# Patient Record
Sex: Female | Born: 1945 | Race: Black or African American | Hispanic: No | Marital: Married | Smoking: Former smoker
Health system: Southern US, Community
[De-identification: ages and names within clinical notes are randomized; demographics above are authoritative.]

## PROBLEM LIST (undated history)

## (undated) DIAGNOSIS — R918 Other nonspecific abnormal finding of lung field: Secondary | ICD-10-CM

## (undated) DIAGNOSIS — C801 Malignant (primary) neoplasm, unspecified: Secondary | ICD-10-CM

## (undated) DIAGNOSIS — E876 Hypokalemia: Secondary | ICD-10-CM

## (undated) DIAGNOSIS — N390 Urinary tract infection, site not specified: Secondary | ICD-10-CM

## (undated) HISTORY — PX: CHOLECYSTECTOMY: SHX55

---

## 1998-08-23 ENCOUNTER — Emergency Department (HOSPITAL_COMMUNITY): Admission: EM | Admit: 1998-08-23 | Discharge: 1998-08-23 | Payer: Self-pay | Admitting: Emergency Medicine

## 2000-10-30 ENCOUNTER — Emergency Department (HOSPITAL_COMMUNITY): Admission: EM | Admit: 2000-10-30 | Discharge: 2000-10-30 | Payer: Self-pay | Admitting: Emergency Medicine

## 2000-10-30 ENCOUNTER — Encounter: Payer: Self-pay | Admitting: Emergency Medicine

## 2003-03-20 ENCOUNTER — Emergency Department (HOSPITAL_COMMUNITY): Admission: EM | Admit: 2003-03-20 | Discharge: 2003-03-20 | Payer: Self-pay | Admitting: Emergency Medicine

## 2003-03-27 ENCOUNTER — Encounter: Admission: RE | Admit: 2003-03-27 | Discharge: 2003-03-27 | Payer: Self-pay | Admitting: Internal Medicine

## 2009-10-25 ENCOUNTER — Emergency Department (HOSPITAL_COMMUNITY): Admission: EM | Admit: 2009-10-25 | Discharge: 2009-10-25 | Payer: Self-pay | Admitting: Emergency Medicine

## 2016-11-25 DIAGNOSIS — E876 Hypokalemia: Secondary | ICD-10-CM

## 2016-11-25 DIAGNOSIS — N390 Urinary tract infection, site not specified: Secondary | ICD-10-CM

## 2016-11-25 DIAGNOSIS — R918 Other nonspecific abnormal finding of lung field: Secondary | ICD-10-CM

## 2016-11-25 HISTORY — DX: Other nonspecific abnormal finding of lung field: R91.8

## 2016-11-25 HISTORY — DX: Urinary tract infection, site not specified: N39.0

## 2016-11-25 HISTORY — DX: Hypokalemia: E87.6

## 2016-12-10 ENCOUNTER — Inpatient Hospital Stay (HOSPITAL_COMMUNITY)
Admission: EM | Admit: 2016-12-10 | Discharge: 2016-12-19 | DRG: 166 | Disposition: A | Discharge status: Skilled Nursing Facility | Payer: Medicare Other | Attending: Family Medicine | Admitting: Family Medicine

## 2016-12-10 ENCOUNTER — Other Ambulatory Visit: Payer: Self-pay

## 2016-12-10 ENCOUNTER — Encounter (HOSPITAL_COMMUNITY): Payer: Self-pay

## 2016-12-10 ENCOUNTER — Emergency Department (HOSPITAL_COMMUNITY): Payer: Medicare Other

## 2016-12-10 DIAGNOSIS — R0600 Dyspnea, unspecified: Secondary | ICD-10-CM

## 2016-12-10 DIAGNOSIS — D6481 Anemia due to antineoplastic chemotherapy: Secondary | ICD-10-CM | POA: Diagnosis not present

## 2016-12-10 DIAGNOSIS — A419 Sepsis, unspecified organism: Secondary | ICD-10-CM | POA: Diagnosis present

## 2016-12-10 DIAGNOSIS — J90 Pleural effusion, not elsewhere classified: Secondary | ICD-10-CM

## 2016-12-10 DIAGNOSIS — R059 Cough, unspecified: Secondary | ICD-10-CM | POA: Diagnosis present

## 2016-12-10 DIAGNOSIS — J9600 Acute respiratory failure, unspecified whether with hypoxia or hypercapnia: Secondary | ICD-10-CM | POA: Diagnosis present

## 2016-12-10 DIAGNOSIS — R05 Cough: Secondary | ICD-10-CM | POA: Diagnosis present

## 2016-12-10 DIAGNOSIS — E876 Hypokalemia: Secondary | ICD-10-CM | POA: Diagnosis present

## 2016-12-10 DIAGNOSIS — N390 Urinary tract infection, site not specified: Secondary | ICD-10-CM | POA: Diagnosis present

## 2016-12-10 DIAGNOSIS — I871 Compression of vein: Secondary | ICD-10-CM | POA: Diagnosis present

## 2016-12-10 DIAGNOSIS — C3400 Malignant neoplasm of unspecified main bronchus: Principal | ICD-10-CM | POA: Diagnosis present

## 2016-12-10 DIAGNOSIS — C3490 Malignant neoplasm of unspecified part of unspecified bronchus or lung: Secondary | ICD-10-CM

## 2016-12-10 DIAGNOSIS — Z9889 Other specified postprocedural states: Secondary | ICD-10-CM

## 2016-12-10 DIAGNOSIS — J189 Pneumonia, unspecified organism: Secondary | ICD-10-CM | POA: Diagnosis present

## 2016-12-10 DIAGNOSIS — J9811 Atelectasis: Secondary | ICD-10-CM | POA: Diagnosis present

## 2016-12-10 DIAGNOSIS — R918 Other nonspecific abnormal finding of lung field: Secondary | ICD-10-CM | POA: Diagnosis present

## 2016-12-10 DIAGNOSIS — B9689 Other specified bacterial agents as the cause of diseases classified elsewhere: Secondary | ICD-10-CM | POA: Diagnosis present

## 2016-12-10 DIAGNOSIS — T451X5A Adverse effect of antineoplastic and immunosuppressive drugs, initial encounter: Secondary | ICD-10-CM | POA: Diagnosis not present

## 2016-12-10 DIAGNOSIS — Z87891 Personal history of nicotine dependence: Secondary | ICD-10-CM

## 2016-12-10 DIAGNOSIS — R7881 Bacteremia: Secondary | ICD-10-CM

## 2016-12-10 HISTORY — DX: Other nonspecific abnormal finding of lung field: R91.8

## 2016-12-10 HISTORY — DX: Urinary tract infection, site not specified: N39.0

## 2016-12-10 HISTORY — DX: Hypokalemia: E87.6

## 2016-12-10 LAB — URINALYSIS, ROUTINE W REFLEX MICROSCOPIC
Glucose, UA: NEGATIVE mg/dL
Hgb urine dipstick: NEGATIVE
KETONES UR: 20 mg/dL — AB
Nitrite: NEGATIVE
PROTEIN: 100 mg/dL — AB
Specific Gravity, Urine: 1.034 — ABNORMAL HIGH (ref 1.005–1.030)
pH: 5 (ref 5.0–8.0)

## 2016-12-10 LAB — COMPREHENSIVE METABOLIC PANEL
ALT: 14 U/L (ref 14–54)
AST: 37 U/L (ref 15–41)
Albumin: 3.3 g/dL — ABNORMAL LOW (ref 3.5–5.0)
Alkaline Phosphatase: 66 U/L (ref 38–126)
Anion gap: 17 — ABNORMAL HIGH (ref 5–15)
BUN: 10 mg/dL (ref 6–20)
CO2: 23 mmol/L (ref 22–32)
Calcium: 9.4 mg/dL (ref 8.9–10.3)
Chloride: 95 mmol/L — ABNORMAL LOW (ref 101–111)
Creatinine, Ser: 1.06 mg/dL — ABNORMAL HIGH (ref 0.44–1.00)
GFR calc Af Amer: >60 mL/min (ref >60)
GFR calc non Af Amer: 53 mL/min — ABNORMAL LOW (ref >60)
Glucose, Bld: 102 mg/dL — ABNORMAL HIGH (ref 65–99)
Potassium: 2.8 mmol/L — ABNORMAL LOW (ref 3.5–5.1)
Sodium: 135 mmol/L (ref 135–145)
Total Bilirubin: 1.8 mg/dL — ABNORMAL HIGH (ref 0.3–1.2)
Total Protein: 7.7 g/dL (ref 6.5–8.1)

## 2016-12-10 LAB — CBC WITH DIFFERENTIAL/PLATELET
Basophils Absolute: 0 10*3/uL (ref 0.0–0.1)
Basophils Relative: 0 %
EOS ABS: 0.1 10*3/uL (ref 0.0–0.7)
EOS PCT: 1 %
HCT: 41.3 % (ref 36.0–46.0)
HEMOGLOBIN: 13 g/dL (ref 12.0–15.0)
LYMPHS PCT: 9 %
Lymphs Abs: 1.1 10*3/uL (ref 0.7–4.0)
MCH: 26.7 pg (ref 26.0–34.0)
MCHC: 31.5 g/dL (ref 30.0–36.0)
MCV: 84.8 fL (ref 78.0–100.0)
MONOS PCT: 6 %
Monocytes Absolute: 0.7 10*3/uL (ref 0.1–1.0)
NEUTROS PCT: 84 %
Neutro Abs: 10.4 10*3/uL — ABNORMAL HIGH (ref 1.7–7.7)
Platelets: 451 10*3/uL — ABNORMAL HIGH (ref 150–400)
RBC: 4.87 MIL/uL (ref 3.87–5.11)
RDW: 15 % (ref 11.5–15.5)
WBC: 12.4 10*3/uL — AB (ref 4.0–10.5)

## 2016-12-10 LAB — BRAIN NATRIURETIC PEPTIDE: B Natriuretic Peptide: 41.4 pg/mL (ref 0.0–100.0)

## 2016-12-10 LAB — TROPONIN I

## 2016-12-10 LAB — I-STAT CG4 LACTIC ACID, ED: Lactic Acid, Venous: 3 mmol/L (ref 0.5–1.9)

## 2016-12-10 MED ORDER — POTASSIUM CHLORIDE 10 MEQ/100ML IV SOLN
10.0000 meq | INTRAVENOUS | Status: AC
  Administered 2016-12-11 (×3): 10 meq via INTRAVENOUS
  Filled 2016-12-10 (×3): qty 100

## 2016-12-10 NOTE — ED Notes (Signed)
Positive Lactic Acid called from lab.  Dr. Darl Householder made aware, will move to next available room.  Charge nurse made aware as well

## 2016-12-10 NOTE — ED Provider Notes (Signed)
[facility] DEPT Provider Note   CSN: [id number] Arrival date & time: 12/10/16  2113  By signing my name below, I, Marcello Moores, attest that this documentation has been prepared under the direction and in the presence of Ramina Hulet, Annie Main, MD. Electronically Signed: Marcello Moores, ED Scribe. 12/10/16. 11:26 PM.  History   Chief Complaint Chief Complaint  Patient presents with  . Cough  . Nasal Congestion   The history is provided by the patient. No language interpreter was used.     HPI Comments: Amanda Macias is a 71 y.o. female who presents to the Emergency Department complaining of moderate, gradually worsening non-productive cough, nasal congestion that began four months ago. Pt has associated fatigue, nausea, diarrhea, subjective fever that began four days ago. She reports that this is the first time she has been seen about her symptoms. The pt denies a PMHx of asthma, COPD, heart problems (including MI and CAD) and lung problems (including PE and DVT) vomiting, abdominal pain, chest pain and leg swelling. Marland Kitchen Her last BM was 4 days ago. Her SHx includes smoking where she quit 15 years ago and used to smoke 1 PPD. NKDA.  No past medical history on file.  There are no active problems to display for this patient.   No past surgical history on file.  OB History    No data available       Home Medications    Prior to Admission medications   Not on File    Family History No family history on file.  Social History Social History  Substance Use Topics  . Smoking status: Not on file  . Smokeless tobacco: Not on file  . Alcohol use Not on file     Allergies   Patient has no allergy information on record.   Review of Systems Review of Systems 10 Systems reviewed and are negative for acute change except as noted in the HPI.   Physical Exam Updated Vital Signs BP (!) 150/75 (BP Location: Right Arm)   Pulse (!) 117   Temp 99.3 F (37.4 C) (Oral)    Resp 16   SpO2 95%   Physical Exam  Constitutional: She is oriented to person, place, and time. She appears well-developed and well-nourished. No distress.  HENT:  Head: Normocephalic and atraumatic.  Mouth/Throat: Oropharynx is clear and moist. No oropharyngeal exudate.  Thrush on tongue Hoarse voice   Eyes: Conjunctivae and EOM are normal. Pupils are equal, round, and reactive to light.  Neck: Normal range of motion. Neck supple.  No meningismus.  Cardiovascular: Normal rate, regular rhythm, normal heart sounds and intact distal pulses.   No murmur heard. Pulmonary/Chest: Effort normal and breath sounds normal. No respiratory distress.  Decreased breath sounds on left  Abdominal: Soft. There is no tenderness. There is no rebound and no guarding.  Musculoskeletal: Normal range of motion. She exhibits no edema or tenderness.  Neurological: She is alert and oriented to person, place, and time. No cranial nerve deficit. She exhibits normal muscle tone. Coordination normal.   5/5 strength throughout. CN 2-12 intact.Equal grip strength.   Skin: Skin is warm.  Psychiatric: She has a normal mood and affect. Her behavior is normal.  Nursing note and vitals reviewed.    ED Treatments / Results   DIAGNOSTIC STUDIES: Oxygen Saturation is 95% on RA, low by my interpretation.   COORDINATION OF CARE: 11:04 PM-Discussed next steps with pt. Pt verbalized understanding and is agreeable with the plan.  Labs (all labs ordered are listed, but only abnormal results are displayed) Labs Reviewed  COMPREHENSIVE METABOLIC PANEL - Abnormal; Notable for the following:       Result Value   Potassium 2.8 (*)    Chloride 95 (*)    Glucose, Bld 102 (*)    Creatinine, Ser 1.06 (*)    Albumin 3.3 (*)    Total Bilirubin 1.8 (*)    GFR calc non Af Amer 53 (*)    Anion gap 17 (*)    All other components within normal limits  CBC WITH DIFFERENTIAL/PLATELET - Abnormal; Notable for the following:     WBC 12.4 (*)    Platelets 451 (*)    Neutro Abs 10.4 (*)    All other components within normal limits  URINALYSIS, ROUTINE W REFLEX MICROSCOPIC - Abnormal; Notable for the following:    Color, Urine AMBER (*)    APPearance CLOUDY (*)    Specific Gravity, Urine 1.034 (*)    Bilirubin Urine MODERATE (*)    Ketones, ur 20 (*)    Protein, ur 100 (*)    Leukocytes, UA SMALL (*)    Bacteria, UA MANY (*)    Squamous Epithelial / LPF 0-5 (*)    All other components within normal limits  MAGNESIUM - Abnormal; Notable for the following:    Magnesium 1.3 (*)    All other components within normal limits  PROTIME-INR - Abnormal; Notable for the following:    Prothrombin Time 16.6 (*)    All other components within normal limits  APTT - Abnormal; Notable for the following:    aPTT 42 (*)    All other components within normal limits  COMPREHENSIVE METABOLIC PANEL - Abnormal; Notable for the following:    Sodium 132 (*)    Potassium 2.8 (*)    Chloride 94 (*)    Glucose, Bld 308 (*)    Calcium 8.5 (*)    Albumin 2.8 (*)    ALT 12 (*)    Total Bilirubin 1.5 (*)    Anion gap 16 (*)    All other components within normal limits  CBC - Abnormal; Notable for the following:    WBC 10.6 (*)    All other components within normal limits  D-DIMER, QUANTITATIVE (NOT AT [facility]) - Abnormal; Notable for the following:    D-Dimer, Quant 8.71 (*)    All other components within normal limits  I-STAT CG4 LACTIC ACID, ED - Abnormal; Notable for the following:    Lactic Acid, Venous 3.00 (*)    All other components within normal limits  I-STAT CG4 LACTIC ACID, ED - Abnormal; Notable for the following:    Lactic Acid, Venous 2.38 (*)    All other components within normal limits  CULTURE, BLOOD (ROUTINE X 2)  CULTURE, BLOOD (ROUTINE X 2)  CULTURE, EXPECTORATED SPUTUM-ASSESSMENT  URINE CULTURE  GRAM STAIN  BRAIN NATRIURETIC PEPTIDE  TROPONIN I  LACTIC ACID, PLASMA  PROCALCITONIN  STREP PNEUMONIAE  URINARY ANTIGEN  HEPARIN LEVEL (UNFRACTIONATED)  HIV ANTIBODY (ROUTINE TESTING)  LEGIONELLA PNEUMOPHILA SEROGP 1 UR AG    EKG  EKG Interpretation None       Radiology Dg Chest 2 View  Result Date: 12/10/2016 CLINICAL DATA:  Cough and nasal congestion for 1 month. EXAM: CHEST  2 VIEW COMPARISON:  None. FINDINGS: There is complete opacification of the left hemithorax. No right-sided pneumothorax. The visualized right lung is clear. The cardiomediastinal silhouette is partially obscured by the  left-sided opacification but no abnormalities are seen. IMPRESSION: Complete opacification of the left hemithorax. A CT scan could further evaluate if clinically warranted. Electronically Signed   By: Dorise Bullion III M.D   On: 12/10/2016 22:18    Procedures Procedures (including critical care time)  Medications Ordered in ED Medications - No data to display   Initial Impression / Assessment and Plan / ED Course  I have reviewed the triage vital signs and the nursing notes.  Pertinent labs & imaging results that were available during my care of the patient were reviewed by me and considered in my medical decision making (see chart for details).     Patient presents with 4 month history of hoarseness, cough and nasal congestion. She has decreased breath sounds on the left with opacity of L hemithorax on Xray.  No distress. She has mild tachypnea but saturating well on nasal cannula. Labs show leukocytosis with elevated lactate. Treated for sepsis for possible pneumonia. Cultures obtained and IV antibiotics given. IV fluids given.  CT scan shows large lung mass with pleural effusion. Cannot rule out small pulmonary emboli. Treat with IV heparin.  Results discussed with patient. Plan admission as she has no PCP.  D/w Dr. Blaine Hamper,  Final Clinical Impressions(sd/ ED Diagnoses   Final diagnoses:  Mass of left lung  Pleural effusion    New Prescriptions New Prescriptions   No  medications on file   I personally performed the services described in this documentation, which was scribed in my presence. The recorded information has been reviewed and is accurate.    Ezequiel Essex, MD 12/11/16 [phone]

## 2016-12-10 NOTE — ED Triage Notes (Signed)
Per EMS: Pt complaining of cough and nasal congestion x 1 month. Pt states coughing up yellow/white sputum. Pt denies any chest pain or SOB.

## 2016-12-11 ENCOUNTER — Encounter (HOSPITAL_COMMUNITY): Payer: Self-pay | Admitting: Internal Medicine

## 2016-12-11 ENCOUNTER — Emergency Department (HOSPITAL_COMMUNITY): Payer: Medicare Other

## 2016-12-11 DIAGNOSIS — E876 Hypokalemia: Secondary | ICD-10-CM | POA: Diagnosis present

## 2016-12-11 DIAGNOSIS — R059 Cough, unspecified: Secondary | ICD-10-CM | POA: Diagnosis present

## 2016-12-11 DIAGNOSIS — R918 Other nonspecific abnormal finding of lung field: Secondary | ICD-10-CM | POA: Diagnosis not present

## 2016-12-11 DIAGNOSIS — R7881 Bacteremia: Secondary | ICD-10-CM | POA: Diagnosis not present

## 2016-12-11 DIAGNOSIS — Z87891 Personal history of nicotine dependence: Secondary | ICD-10-CM | POA: Diagnosis not present

## 2016-12-11 DIAGNOSIS — J9600 Acute respiratory failure, unspecified whether with hypoxia or hypercapnia: Secondary | ICD-10-CM | POA: Diagnosis present

## 2016-12-11 DIAGNOSIS — N3000 Acute cystitis without hematuria: Secondary | ICD-10-CM | POA: Diagnosis not present

## 2016-12-11 DIAGNOSIS — J189 Pneumonia, unspecified organism: Secondary | ICD-10-CM | POA: Diagnosis present

## 2016-12-11 DIAGNOSIS — B960 Mycoplasma pneumoniae [M. pneumoniae] as the cause of diseases classified elsewhere: Secondary | ICD-10-CM | POA: Diagnosis not present

## 2016-12-11 DIAGNOSIS — M7989 Other specified soft tissue disorders: Secondary | ICD-10-CM | POA: Diagnosis not present

## 2016-12-11 DIAGNOSIS — C3490 Malignant neoplasm of unspecified part of unspecified bronchus or lung: Secondary | ICD-10-CM | POA: Diagnosis not present

## 2016-12-11 DIAGNOSIS — A4151 Sepsis due to Escherichia coli [E. coli]: Secondary | ICD-10-CM | POA: Diagnosis not present

## 2016-12-11 DIAGNOSIS — C3400 Malignant neoplasm of unspecified main bronchus: Secondary | ICD-10-CM | POA: Diagnosis present

## 2016-12-11 DIAGNOSIS — J9811 Atelectasis: Secondary | ICD-10-CM | POA: Diagnosis present

## 2016-12-11 DIAGNOSIS — N390 Urinary tract infection, site not specified: Secondary | ICD-10-CM | POA: Diagnosis not present

## 2016-12-11 DIAGNOSIS — A419 Sepsis, unspecified organism: Secondary | ICD-10-CM | POA: Diagnosis not present

## 2016-12-11 DIAGNOSIS — R05 Cough: Secondary | ICD-10-CM | POA: Diagnosis not present

## 2016-12-11 DIAGNOSIS — D6481 Anemia due to antineoplastic chemotherapy: Secondary | ICD-10-CM | POA: Diagnosis not present

## 2016-12-11 DIAGNOSIS — J90 Pleural effusion, not elsewhere classified: Secondary | ICD-10-CM | POA: Diagnosis present

## 2016-12-11 DIAGNOSIS — B9689 Other specified bacterial agents as the cause of diseases classified elsewhere: Secondary | ICD-10-CM | POA: Diagnosis not present

## 2016-12-11 DIAGNOSIS — Z5111 Encounter for antineoplastic chemotherapy: Secondary | ICD-10-CM | POA: Diagnosis not present

## 2016-12-11 DIAGNOSIS — J918 Pleural effusion in other conditions classified elsewhere: Secondary | ICD-10-CM | POA: Diagnosis not present

## 2016-12-11 DIAGNOSIS — R0600 Dyspnea, unspecified: Secondary | ICD-10-CM | POA: Diagnosis present

## 2016-12-11 DIAGNOSIS — B9620 Unspecified Escherichia coli [E. coli] as the cause of diseases classified elsewhere: Secondary | ICD-10-CM | POA: Diagnosis not present

## 2016-12-11 DIAGNOSIS — I871 Compression of vein: Secondary | ICD-10-CM | POA: Diagnosis present

## 2016-12-11 DIAGNOSIS — T451X5A Adverse effect of antineoplastic and immunosuppressive drugs, initial encounter: Secondary | ICD-10-CM | POA: Diagnosis not present

## 2016-12-11 DIAGNOSIS — R110 Nausea: Secondary | ICD-10-CM | POA: Diagnosis not present

## 2016-12-11 LAB — BLOOD CULTURE ID PANEL (REFLEXED)
Acinetobacter baumannii: NOT DETECTED
Acinetobacter baumannii: NOT DETECTED
CANDIDA KRUSEI: NOT DETECTED
CANDIDA PARAPSILOSIS: NOT DETECTED
CANDIDA TROPICALIS: NOT DETECTED
CARBAPENEM RESISTANCE: NOT DETECTED
Candida albicans: NOT DETECTED
Candida albicans: NOT DETECTED
Candida glabrata: NOT DETECTED
Candida glabrata: NOT DETECTED
Candida krusei: NOT DETECTED
Candida parapsilosis: NOT DETECTED
Candida tropicalis: NOT DETECTED
ENTEROBACTERIACEAE SPECIES: DETECTED — AB
Enterobacter cloacae complex: DETECTED — AB
Enterobacter cloacae complex: NOT DETECTED
Enterobacteriaceae species: NOT DETECTED
Enterococcus species: NOT DETECTED
Enterococcus species: NOT DETECTED
Escherichia coli: NOT DETECTED
Escherichia coli: NOT DETECTED
HAEMOPHILUS INFLUENZAE: NOT DETECTED
Haemophilus influenzae: NOT DETECTED
KLEBSIELLA PNEUMONIAE: NOT DETECTED
Klebsiella oxytoca: NOT DETECTED
Klebsiella oxytoca: NOT DETECTED
Klebsiella pneumoniae: NOT DETECTED
Listeria monocytogenes: NOT DETECTED
Listeria monocytogenes: NOT DETECTED
Neisseria meningitidis: NOT DETECTED
Neisseria meningitidis: NOT DETECTED
Proteus species: NOT DETECTED
Proteus species: NOT DETECTED
Pseudomonas aeruginosa: NOT DETECTED
Pseudomonas aeruginosa: NOT DETECTED
SERRATIA MARCESCENS: NOT DETECTED
STAPHYLOCOCCUS AUREUS BCID: NOT DETECTED
STAPHYLOCOCCUS SPECIES: NOT DETECTED
STREPTOCOCCUS SPECIES: NOT DETECTED
Serratia marcescens: NOT DETECTED
Staphylococcus aureus (BCID): NOT DETECTED
Staphylococcus species: NOT DETECTED
Streptococcus agalactiae: NOT DETECTED
Streptococcus agalactiae: NOT DETECTED
Streptococcus pneumoniae: NOT DETECTED
Streptococcus pneumoniae: NOT DETECTED
Streptococcus pyogenes: NOT DETECTED
Streptococcus pyogenes: NOT DETECTED
Streptococcus species: NOT DETECTED

## 2016-12-11 LAB — HEPARIN LEVEL (UNFRACTIONATED)
HEPARIN UNFRACTIONATED: 0.49 [IU]/mL (ref 0.30–0.70)
Heparin Unfractionated: 0.48 IU/mL (ref 0.30–0.70)

## 2016-12-11 LAB — CBC
HEMATOCRIT: 37.6 % (ref 36.0–46.0)
Hemoglobin: 12 g/dL (ref 12.0–15.0)
MCH: 26.5 pg (ref 26.0–34.0)
MCHC: 31.9 g/dL (ref 30.0–36.0)
MCV: 83.2 fL (ref 78.0–100.0)
PLATELETS: 385 10*3/uL (ref 150–400)
RBC: 4.52 MIL/uL (ref 3.87–5.11)
RDW: 14.9 % (ref 11.5–15.5)
WBC: 10.6 10*3/uL — AB (ref 4.0–10.5)

## 2016-12-11 LAB — COMPREHENSIVE METABOLIC PANEL
ALT: 12 U/L — AB (ref 14–54)
AST: 29 U/L (ref 15–41)
Albumin: 2.8 g/dL — ABNORMAL LOW (ref 3.5–5.0)
Alkaline Phosphatase: 54 U/L (ref 38–126)
Anion gap: 16 — ABNORMAL HIGH (ref 5–15)
BUN: 8 mg/dL (ref 6–20)
CHLORIDE: 94 mmol/L — AB (ref 101–111)
CO2: 22 mmol/L (ref 22–32)
CREATININE: 0.88 mg/dL (ref 0.44–1.00)
Calcium: 8.5 mg/dL — ABNORMAL LOW (ref 8.9–10.3)
Glucose, Bld: 308 mg/dL — ABNORMAL HIGH (ref 65–99)
POTASSIUM: 2.8 mmol/L — AB (ref 3.5–5.1)
SODIUM: 132 mmol/L — AB (ref 135–145)
Total Bilirubin: 1.5 mg/dL — ABNORMAL HIGH (ref 0.3–1.2)
Total Protein: 6.6 g/dL (ref 6.5–8.1)

## 2016-12-11 LAB — APTT: aPTT: 42 seconds — ABNORMAL HIGH (ref 24–36)

## 2016-12-11 LAB — I-STAT CG4 LACTIC ACID, ED: Lactic Acid, Venous: 2.38 mmol/L (ref 0.5–1.9)

## 2016-12-11 LAB — LACTIC ACID, PLASMA: LACTIC ACID, VENOUS: 1.7 mmol/L (ref 0.5–1.9)

## 2016-12-11 LAB — PROTIME-INR
INR: 1.33
Prothrombin Time: 16.6 seconds — ABNORMAL HIGH (ref 11.4–15.2)

## 2016-12-11 LAB — STREP PNEUMONIAE URINARY ANTIGEN: Strep Pneumo Urinary Antigen: NEGATIVE

## 2016-12-11 LAB — D-DIMER, QUANTITATIVE: D-Dimer, Quant: 8.71 ug/mL-FEU — ABNORMAL HIGH (ref 0.00–0.50)

## 2016-12-11 LAB — PROCALCITONIN

## 2016-12-11 LAB — GLUCOSE, CAPILLARY: Glucose-Capillary: 113 mg/dL — ABNORMAL HIGH (ref 65–99)

## 2016-12-11 LAB — MAGNESIUM: MAGNESIUM: 1.3 mg/dL — AB (ref 1.7–2.4)

## 2016-12-11 LAB — HIV ANTIBODY (ROUTINE TESTING W REFLEX): HIV Screen 4th Generation wRfx: NONREACTIVE

## 2016-12-11 MED ORDER — ALBUTEROL SULFATE (2.5 MG/3ML) 0.083% IN NEBU: 2.5000 mg | INHALATION_SOLUTION | RESPIRATORY_TRACT | Status: DC | PRN

## 2016-12-11 MED ORDER — HEPARIN BOLUS VIA INFUSION
5000.0000 [IU] | Freq: Once | INTRAVENOUS | Status: AC
  Administered 2016-12-11: 5000 [IU] via INTRAVENOUS
  Filled 2016-12-11: qty 5000

## 2016-12-11 MED ORDER — ZOLPIDEM TARTRATE 5 MG PO TABS: 5.0000 mg | ORAL_TABLET | Freq: Every evening | ORAL | Status: DC | PRN

## 2016-12-11 MED ORDER — DEXTROSE 5 % IV SOLN
1.0000 g | Freq: Once | INTRAVENOUS | Status: AC
  Administered 2016-12-11: 1 g via INTRAVENOUS
  Filled 2016-12-11: qty 10

## 2016-12-11 MED ORDER — DM-GUAIFENESIN ER 30-600 MG PO TB12
1.0000 | ORAL_TABLET | Freq: Two times a day (BID) | ORAL | Status: DC
  Administered 2016-12-11 – 2016-12-19 (×15): 1 via ORAL
  Filled 2016-12-11 (×17): qty 1

## 2016-12-11 MED ORDER — SODIUM CHLORIDE 0.9% FLUSH
3.0000 mL | Freq: Two times a day (BID) | INTRAVENOUS | Status: DC
  Administered 2016-12-11 – 2016-12-18 (×8): 3 mL via INTRAVENOUS

## 2016-12-11 MED ORDER — ACETAMINOPHEN 650 MG RE SUPP: 650.0000 mg | Freq: Four times a day (QID) | RECTAL | Status: DC | PRN

## 2016-12-11 MED ORDER — SODIUM CHLORIDE 0.9 % IV BOLUS (SEPSIS)
2000.0000 mL | Freq: Once | INTRAVENOUS | Status: AC
  Administered 2016-12-11: 1000 mL via INTRAVENOUS

## 2016-12-11 MED ORDER — HEPARIN (PORCINE) IN NACL 100-0.45 UNIT/ML-% IJ SOLN
1400.0000 [IU]/h | INTRAMUSCULAR | Status: DC
  Administered 2016-12-11 (×2): 1400 [IU]/h via INTRAVENOUS
  Filled 2016-12-11 (×2): qty 250

## 2016-12-11 MED ORDER — ACETAMINOPHEN 325 MG PO TABS: 650.0000 mg | ORAL_TABLET | Freq: Four times a day (QID) | ORAL | Status: DC | PRN

## 2016-12-11 MED ORDER — ONDANSETRON HCL 4 MG PO TABS: 4.0000 mg | ORAL_TABLET | Freq: Four times a day (QID) | ORAL | Status: DC | PRN

## 2016-12-11 MED ORDER — MAGNESIUM SULFATE IN D5W 1-5 GM/100ML-% IV SOLN
1.0000 g | Freq: Once | INTRAVENOUS | Status: AC
  Administered 2016-12-11: 1 g via INTRAVENOUS
  Filled 2016-12-11: qty 100

## 2016-12-11 MED ORDER — DEXTROSE 5 % IV SOLN
500.0000 mg | Freq: Once | INTRAVENOUS | Status: AC
  Administered 2016-12-11: 500 mg via INTRAVENOUS
  Filled 2016-12-11: qty 500

## 2016-12-11 MED ORDER — DEXTROSE 5 % IV SOLN
1.0000 g | Freq: Every day | INTRAVENOUS | Status: DC
  Filled 2016-12-11: qty 10

## 2016-12-11 MED ORDER — CEFEPIME HCL 1 G IJ SOLR
1.0000 g | Freq: Three times a day (TID) | INTRAMUSCULAR | Status: DC
  Administered 2016-12-11 – 2016-12-15 (×12): 1 g via INTRAVENOUS
  Filled 2016-12-11 (×13): qty 1

## 2016-12-11 MED ORDER — SODIUM CHLORIDE 0.9 % IV SOLN
INTRAVENOUS | Status: DC
  Administered 2016-12-11 (×2): via INTRAVENOUS
  Administered 2016-12-12 (×2): 1000 mL via INTRAVENOUS
  Administered 2016-12-13 – 2016-12-18 (×4): via INTRAVENOUS

## 2016-12-11 MED ORDER — POTASSIUM CHLORIDE 20 MEQ/15ML (10%) PO SOLN
40.0000 meq | Freq: Once | ORAL | Status: AC
  Administered 2016-12-11: 40 meq via ORAL
  Filled 2016-12-11: qty 30

## 2016-12-11 MED ORDER — ONDANSETRON HCL 4 MG/2ML IJ SOLN: 4.0000 mg | Freq: Four times a day (QID) | INTRAMUSCULAR | Status: DC | PRN

## 2016-12-11 MED ORDER — DEXTROSE 5 % IV SOLN
500.0000 mg | INTRAVENOUS | Status: DC
  Administered 2016-12-12 – 2016-12-13 (×2): 500 mg via INTRAVENOUS
  Filled 2016-12-11 (×2): qty 500

## 2016-12-11 MED ORDER — IOPAMIDOL (ISOVUE-370) INJECTION 76%
INTRAVENOUS | Status: AC
  Administered 2016-12-11: 100 mL
  Filled 2016-12-11: qty 100

## 2016-12-11 MED ORDER — SODIUM CHLORIDE 0.9 % IV BOLUS (SEPSIS)
1000.0000 mL | Freq: Once | INTRAVENOUS | Status: AC
  Administered 2016-12-11: 1000 mL via INTRAVENOUS

## 2016-12-11 NOTE — H&P (Signed)
History and Physical    OUITA NISH EUM:[id number] DOB: [birth year] DOA: 12/10/2016  Referring MD/NP/PA:   PCP: System, Pcp Not In   Patient coming from:  The patient is coming from home.  At baseline, pt is independent for most of ADL.   Chief Complaint: Cough, shortness breath, generalized weakness, weight loss  HPI: Amanda Macias is a 71 y.o. female without significant past medical history except for former smoking, who presents with cough, shortness breath, generalized weakness and weight loss.  Patient states that she has been coughing for about 1 month. She coughs up yellow colored sputum. She also has SOB, but no chest pain, fever or chills. She has hoarseness. She has generalized weakness, but no unilateral weakness or numbness in extremities. She states that she has weight loss recently, but does not know exactly how much weight she lost. Denies nausea, vomiting, diarrhea, abdominal pai.  Urinary right n. No symptoms of UTI. Denies family history of cancer.  ED Course: pt was found to have WBC 12.4, lactate 3.00, 2.38, potassium 2.8, creatinine 1.06, negative troponin, urinalysis with small amount of leukocytes and many bacteria, temperature 99.3, tachycardia, tachypnea, oxygen sat 92% on room air, chest x-ray showed complete left opacity. Pt is admitted to tele bed as inpt.  CTA of chest showed 1. Excessive respiratory motion artifact limits the study. No central embolus is visualized. Under filling of subsegmental right lower lobe pulmonary artery branch vessels could be secondary to artifact although small emboli are difficult to exclude. 2. Large left pleural effusion. Large left upper lobe infiltrative lung mass extending into the left apex. There is mediastinal and hilar invasion by the mass. Vascular encasement of the left pulmonary artery and upper lobe arterial branches by tumor. Tumor encasement of the great vessels of the aortic arch, primarily the left common carotid and  subclavian vessels. 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 3. Soft tissue density within the left hilus and subcarinal region could relate to additional tumor or matted adenopathy.  Review of Systems:   General: No fevers, chills, has weight loss, has poor appetite, has fatigue HEENT: no blurry vision, hearing changes or sore throat Respiratory: has dyspnea, coughing, no wheezing CV: no chest pain, no palpitations GI: no nausea, vomiting, abdominal pain, diarrhea, constipation GU: no dysuria, burning on urination, increased urinary frequency, hematuria  Ext: no leg edema Neuro: no unilateral weakness, numbness, or tingling, no vision change or hearing loss Skin: no rash, no skin tear. MSK: No muscle spasm, no deformity, no limitation of range of movement in spin Heme: No easy bruising.  Travel history: No recent long distant travel.  Allergy: No Known Allergies  No past medical history on file.  Past Surgical History:  Procedure Laterality Date  . CHOLECYSTECTOMY      Social History:  reports that she has quit smoking. She does not have any smokeless tobacco history on file. She reports that she does not drink alcohol or use drugs.  Family History:  Family History  Problem Relation Age of Onset  . Stroke Father      Prior to Admission medications   Not on File    Physical Exam: Vitals:   12/11/16 0000 12/11/16 0200 12/11/16 0224 12/11/16 0230  BP: 118/74 118/70  99/78  Pulse: 93 97  90  Resp: (!) 22 (!) 24  (!) 23  Temp:      TempSrc:  SpO2: 98% 97%  100%  Weight:   112.5 kg (248 lb 2 oz)   Height:   5\' 5"  (1.651 m)    General: Not in acute distress HEENT:       Eyes: PERRL, EOMI, no scleral icterus.       ENT: No discharge from the ears and nose, no pharynx injection, no tonsillar enlargement.        Neck: No JVD, no bruit, no mass felt. Heme: No neck  lymph node enlargement. Cardiac: S1/S2, RRR, No murmurs, No gallops or rubs. Respiratory: decreased air movement on the left side. No rales, wheezing, rhonchi or rubs. GI: Soft, nondistended, nontender, no rebound pain, no organomegaly, BS present. GU: No hematuria Ext: No pitting leg edema bilaterally. 2+DP/PT pulse bilaterally. Musculoskeletal: No joint deformities, No joint redness or warmth, no limitation of ROM in spin. Skin: No rashes.  Neuro: Alert, oriented X3, cranial nerves II-XII grossly intact, moves all extremities normally.  Psych: Patient is not psychotic, no suicidal or hemocidal ideation.  Labs on Admission: I have personally reviewed following labs and imaging studies  CBC:  Recent Labs Lab 12/10/16 2214  WBC 12.4*  NEUTROABS 10.4*  HGB 13.0  HCT 41.3  MCV 84.8  PLT 846*   Basic Metabolic Panel:  Recent Labs Lab 12/10/16 2214  NA 135  K 2.8*  CL 95*  CO2 23  GLUCOSE 102*  BUN 10  CREATININE 1.06*  CALCIUM 9.4   GFR: Estimated Creatinine Clearance: 63.5 mL/min (A) (by C-G formula based on SCr of 1.06 mg/dL (H)). Liver Function Tests:  Recent Labs Lab 12/10/16 2214  AST 37  ALT 14  ALKPHOS 66  BILITOT 1.8*  PROT 7.7  ALBUMIN 3.3*   No results for input(s): LIPASE, AMYLASE in the last 168 hours. No results for input(s): AMMONIA in the last 168 hours. Coagulation Profile: No results for input(s): INR, PROTIME in the last 168 hours. Cardiac Enzymes:  Recent Labs Lab 12/10/16 2214  TROPONINI <0.03   BNP (last 3 results) No results for input(s): PROBNP in the last 8760 hours. HbA1C: No results for input(s): HGBA1C in the last 72 hours. CBG: No results for input(s): GLUCAP in the last 168 hours. Lipid Profile: No results for input(s): CHOL, HDL, LDLCALC, TRIG, CHOLHDL, LDLDIRECT in the last 72 hours. Thyroid Function Tests: No results for input(s): TSH, T4TOTAL, FREET4, T3FREE, THYROIDAB in the last 72 hours. Anemia Panel: No  results for input(s): VITAMINB12, FOLATE, FERRITIN, TIBC, IRON, RETICCTPCT in the last 72 hours. Urine analysis:    Component Value Date/Time   COLORURINE AMBER (A) 12/10/2016 2208   APPEARANCEUR CLOUDY (A) 12/10/2016 2208   LABSPEC 1.034 (H) 12/10/2016 2208   PHURINE 5.0 12/10/2016 2208   GLUCOSEU NEGATIVE 12/10/2016 2208   HGBUR NEGATIVE 12/10/2016 2208   BILIRUBINUR MODERATE (A) 12/10/2016 2208   KETONESUR 20 (A) 12/10/2016 2208   PROTEINUR 100 (A) 12/10/2016 2208   NITRITE NEGATIVE 12/10/2016 2208   LEUKOCYTESUR SMALL (A) 12/10/2016 2208   Sepsis Labs: @LABRCNTIP (procalcitonin:4,lacticidven:4) )No results found for this or any previous visit (from the past 240 hour(s)).   Radiological Exams on Admission: Dg Chest 2 View  Result Date: 12/10/2016 CLINICAL DATA:  Cough and nasal congestion for 1 month. EXAM: CHEST  2 VIEW COMPARISON:  None. FINDINGS: There is complete opacification of the left hemithorax. No right-sided pneumothorax. The visualized right lung is clear. The cardiomediastinal silhouette is partially obscured by the left-sided opacification but no abnormalities are seen. IMPRESSION: Complete  opacification of the left hemithorax. A CT scan could further evaluate if clinically warranted. Electronically Signed   By: Dorise Bullion III M.D   On: 12/10/2016 22:18   Ct Angio Chest Pe W Or Wo Contrast  Result Date: 12/11/2016 CLINICAL DATA:  Follow-up abnormal chest x-ray EXAM: CT ANGIOGRAPHY CHEST WITH CONTRAST TECHNIQUE: Multidetector CT imaging of the chest was performed using the standard protocol during bolus administration of intravenous contrast. Multiplanar CT image reconstructions and MIPs were obtained to evaluate the vascular anatomy. CONTRAST:  80 mL Isovue 370 intravenous COMPARISON:  Radiograph 12/10/2016 FINDINGS: Cardiovascular: Excessive respiratory motion artifact limits the examination. No central embolus is visualized. Tiny hypodensities within sub segmental  right lower lobe branch vessels could be due to artifact although tiny emboli are not excluded. Non aneurysmal aorta. Atherosclerosis. No dissection is seen. There is narrowing of the left pulmonary artery and multiple left upper lobe branch vessels due to a large mass in the lung. There is tumor in case min and narrowing of the left common carotid and subclavian vessels. Central venous obstruction by tumor with multiple chest wall and paraspinal collateral vessels filling the SVC and brachiocephalic vessels. Mediastinum/Nodes: Midline trachea. Right lobe of thyroid within normal limits. Poorly defined left lobe of thyroid. Esophagus unremarkable. Infiltrative soft tissue mass extends into the superior mediastinum/ thoracic inlet and involves the left hilar structures. Tumor or matted adenopathy within the precarinal space. Soft tissue mass in the subcarinal region measuring at least 2.9 cm. Slight narrowed appearance of the left bronchus. Lungs/Pleura: Large left pleural effusion with mild shift to the right. There is atelectasis in the left lower lobe. Ill-defined soft tissue mass in the left upper lobe, precise measurements difficult due to the presence of adjacent pleural effusion and poorly defined appearance of the mass. Mass measures at least 8.6 cm transverse by 9.1 cm AP by 12.8 cm craniocaudad. Upper Abdomen: Surgical clips in the gallbladder fossa. No acute abnormality is seen. Musculoskeletal: No acute osseous abnormality. Review of the MIP images confirms the above findings. IMPRESSION: 1. Excessive respiratory motion artifact limits the study. No central embolus is visualized. Under filling of subsegmental right lower lobe pulmonary artery branch vessels could be secondary to artifact although small emboli are difficult to exclude. 2. Large left pleural effusion. Large left upper lobe infiltrative lung mass extending into the left apex. There is mediastinal and hilar invasion by the mass. Vascular  encasement of the left pulmonary artery and upper lobe arterial branches by tumor. Tumor encasement of the great vessels of the aortic arch, primarily the left common carotid and subclavian vessels. 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 3. Soft tissue density within the left hilus and subcarinal region could relate to additional tumor or matted adenopathy. Aortic Atherosclerosis (ICD10-I70.0). Electronically Signed   By: Donavan Foil M.D.   On: 12/11/2016 02:07     EKG: Independently reviewed.  Sinus rhythm, QTC 475, low voltage, early R-wave progression.  Assessment/Plan Principal Problem:   Mass of left lung Active Problems:   Cough   Hypokalemia   Sepsis (HCC)   UTI (urinary tract infection)   Pleural effusion   Left lung mass and left pleural effusion: Patient is former smoker, has weight loss recently, very concerning for malignancy. There is mediastinal and hilar invasion by the mass. Vascular encasement of the left pulmonary artery and upper lobe arterial branches by tumor. Tumor encasement of  the great vessels of the aortic arch, primarily the left common carotid and subclavian vessels.   -will admit to tele bed as inpt -please call oncology in AM -may also need to call IR for thoracentesis or biopsy  Cough: Patient has productive cough and shortness breath. Most likely due to Mass and Left Pleural Effusion, but the patient has leukocytosis, elevated lactic acid, tachycardia and tachypnea, meets criteria for sepsis. Will start antibiotics to cover possible CAP.  - IV Rocephin and azithromycin - Mucinex for cough  - prn Albuterol Nebs, for SOB - Urine legionella and S. pneumococcal antigen - Follow up blood culture x2, sputum culture - will get Procalcitonin and trend lactic acid level per sepsis protocol - IVF: 3L of NS bolus in ED, followed by 75 mL per hour of NS    Possible PE: CTA was done, which is limited study, no large PE, but cannot completely rule out small PE.  -IV heparin was started in ED, will continue now -will get D-dimer. If D-dimer is negative-->will d/c IV heparin  Hypokalemia: K= 2.8 on admission. - Repleted K - give 1 g of magnesium sulfate - Check Mg level  Possible UTI: Urinalysis showed small amount of leukocytes and many bacteria. The patient denies symptoms of UTI, but meets criteria for sepsis. -on IV abx as above -f/u urine culture.   DVT ppx: on IV heparin Code Status: Full code Family Communication: None at bed side.  Disposition Plan:  Anticipate discharge back to previous home environment Consults called:  none Admission status: Inpatient/tele    Date of Service 12/11/2016   The patient is easy bruising. Rule out PE.  He is not Ivor Costa [facility] Pager [phone]  If 7PM-7AM, please contact night-coverage [email or web address] Password [email or web address] 12/11/2016, 3:41 AM

## 2016-12-11 NOTE — Progress Notes (Signed)
ANTICOAGULATION CONSULT NOTE - Initial Consult  Pharmacy Consult for Heparin Indication: pulmonary embolus  No Known Allergies  Patient Measurements: Height: 5\' 5"  (165.1 cm) Weight: 248 lb 2 oz (112.5 kg) IBW/kg (Calculated) : 57 Heparin Dosing Weight: 83 kg  Vital Signs: Temp: 99.3 F (37.4 C) (06/16 2116) Temp Source: Oral (06/16 2116) BP: 118/74 (06/17 0000) Pulse Rate: 93 (06/17 0000)  Labs:  Recent Labs  12/10/16 2214  HGB 13.0  HCT 41.3  PLT 451*  CREATININE 1.06*  TROPONINI <0.03    Estimated Creatinine Clearance: 63.5 mL/min (A) (by C-G formula based on SCr of 1.06 mg/dL (H)).   Medical History: No past medical history on file.  Medications:  No home medications  Assessment: 71 y.o. F presents with cough/nasal congestion. CT shows under filling of subsegmental right lower lobe pulmonary artery branch vessels could be secondary to artifact although small emboli are difficult to exclude. Noted pt with h/o PE, DVT but not on AC PTA. To begin heparin for PE.   Goal of Therapy:  Heparin level 0.3-0.7 units/ml Monitor platelets by anticoagulation protocol: Yes   Plan:  Heparin IV bolus 5000 units Heparin gtt at 1400 units/hr Will f/u heparin level in 6 hours Daily heparin level and CBC  Sherlon Handing, PharmD, BCPS Clinical pharmacist, pager [phone] 12/11/2016,2:41 AM

## 2016-12-11 NOTE — Progress Notes (Addendum)
ANTICOAGULATION CONSULT NOTE  Pharmacy Consult for Heparin Indication: pulmonary embolus  No Known Allergies  Patient Measurements: Height: 5\' 5"  (165.1 cm) Weight: 246 lb 12.8 oz (111.9 kg) IBW/kg (Calculated) : 57 Heparin Dosing Weight: 83 kg  Vital Signs: Temp: 98.6 F (37 C) (06/17 0458) BP: 138/83 (06/17 0458) Pulse Rate: 95 (06/17 0458)  Labs:  Recent Labs  12/10/16 2214 12/11/16 0307 12/11/16 0839  HGB 13.0 12.0  --   HCT 41.3 37.6  --   PLT 451* 385  --   APTT  --  42*  --   LABPROT  --  16.6*  --   INR  --  1.33  --   HEPARINUNFRC  --   --  0.49  CREATININE 1.06* 0.88  --   TROPONINI <0.03  --   --     Estimated Creatinine Clearance: 76.3 mL/min (by C-G formula based on SCr of 0.88 mg/dL).   Medical History: History reviewed. No pertinent past medical history.  Medications:  No home medications  Assessment: 71 y.o. F presents with cough/nasal congestion. CT shows under filling of subsegmental right lower lobe pulmonary artery branch vessels could be secondary to artifact although small emboli are difficult to exclude. Noted pt with h/o PE, DVT but not on AC PTA. To begin heparin for PE.  Initial heparin level in therapeutic range at 0.49 units/mL. Hgb and plts stable, no bleeding noted.   Goal of Therapy:  Heparin level 0.3-0.7 units/ml Monitor platelets by anticoagulation protocol: Yes   Plan:  Continue heparin gtt at 1400 units/hr Confirmatory heparin level in 6 hours Daily heparin level and CBC  Amanda Macias, PharmD, BCPS Clinical Pharmacist Pager: [phone] Clinical Phone for 12/11/2016 until 3:30pm: [phone] If after 3:30pm, please call main pharmacy at [phone] 12/11/2016 11:53 AM   ADDENDUM Confirmatory level this evening remains therapeutic at 0.48 units/mL. No bleeding noted.  Plan: Continue heparin at 1400 units/hr Daily heparin level and CBC Follow long term anticoagulation plan  Amanda Macias, PharmD, BCPS Clinical  Pharmacist 12/11/2016 4:06 PM

## 2016-12-11 NOTE — Progress Notes (Signed)
PHARMACY - PHYSICIAN COMMUNICATION CRITICAL VALUE ALERT - BLOOD CULTURE IDENTIFICATION (BCID)  Results for orders placed or performed during the hospital encounter of 12/10/16  Blood Culture ID Panel (Reflexed) (Collected: 12/10/2016 10:15 PM)  Result Value Ref Range   Enterococcus species NOT DETECTED NOT DETECTED   Listeria monocytogenes NOT DETECTED NOT DETECTED   Staphylococcus species NOT DETECTED NOT DETECTED   Staphylococcus aureus NOT DETECTED NOT DETECTED   Streptococcus species NOT DETECTED NOT DETECTED   Streptococcus agalactiae NOT DETECTED NOT DETECTED   Streptococcus pneumoniae NOT DETECTED NOT DETECTED   Streptococcus pyogenes NOT DETECTED NOT DETECTED   Acinetobacter baumannii NOT DETECTED NOT DETECTED   Enterobacteriaceae species DETECTED (A) NOT DETECTED   Enterobacter cloacae complex DETECTED (A) NOT DETECTED   Escherichia coli NOT DETECTED NOT DETECTED   Klebsiella oxytoca NOT DETECTED NOT DETECTED   Klebsiella pneumoniae NOT DETECTED NOT DETECTED   Proteus species NOT DETECTED NOT DETECTED   Serratia marcescens NOT DETECTED NOT DETECTED   Carbapenem resistance NOT DETECTED NOT DETECTED   Haemophilus influenzae NOT DETECTED NOT DETECTED   Neisseria meningitidis NOT DETECTED NOT DETECTED   Pseudomonas aeruginosa NOT DETECTED NOT DETECTED   Candida albicans NOT DETECTED NOT DETECTED   Candida glabrata NOT DETECTED NOT DETECTED   Candida krusei NOT DETECTED NOT DETECTED   Candida parapsilosis NOT DETECTED NOT DETECTED   Candida tropicalis NOT DETECTED NOT DETECTED    Name of physician (or Provider) Contacted: Dr Cathlean Sauer  Changes to prescribed antibiotics required: Dc ctx, change to cefepime 1 g q8h  Levester Fresh, PharmD, BCPS, BCCCP Clinical Pharmacist Clinical phone for 12/11/2016 from 7a-3:30p: [phone] If after 3:30p, please call main pharmacy at: [phone] 12/11/2016 2:47 PM

## 2016-12-11 NOTE — Progress Notes (Signed)
PROGRESS NOTE    Amanda Macias  ATF:[id number] DOB: 1946/04/10 DOA: 12/10/2016 PCP: System, Pcp Not In    Brief Narrative:  71 year old female presents with cough, shortness of breath, generalized weakness and weight loss. Cough for last 4 weeks, productive, associated shortness of breath and generalized weakness. Initial physical examination blood pressure 118/74, heart rate 93, respiratory 22, saturation 98%, moist mucous membranes, lungs with no rales, wheezing rhonchi, heart S1-S2 present rhythmic, abdomen soft nontender, lower extremities no edema. Sodium 132, potassium 2.8, chloride 94, bicarbonate 22, glucose 308, creatinine 0.8, BUN 8, white count 10.6, hemoglobin 12.0, hematocrit 37.6, platelets 385, d-dimer 8.7. Urinalysis with too numerous count white cells, too numerous to count RBCs. CT chest with underfilling of a subsegmental right lower lobe pulmonary artery branch suspected small emboli. Large left pleural effusion with a large left upper lobe infiltrate, mediastinal and hilar  invasion of the mass, tumor encasement of the great vessels aortic arch, primarily the left common carotid and subclavian vessels, venous obstruction in the upper mediastinum with result in extensive collaterals. Chest x-ray with extensive left lung opacity.  EKG was sinus rhythm.  Patient admitted with left lung mass, complicated by pulmonary embolism, urinary tract infection and hypokalemia.   Assessment & Plan:   Principal Problem:   Mass of left lung Active Problems:   Cough   Hypokalemia   Sepsis (HCC)   UTI (urinary tract infection)   Pleural effusion   1. Left lung mass with associated pleural effusion. Will continue on antibiotic therapy for community acquired pneumonia for now. Continue oxymetry monitoring and supplemental 02 per [place], case discussed with IR and pulmonary will precede with thoracentesis, NPO after midnight for possible diagnostic bronchoscopy. No significant respiratory distress  on physical examination.   2. Urinary tract infection present on admission.  Blood culture positive for enterobacter, will change antibiotic therapy to cefepime.   3. Pulmonary embolism. Elevated d dimer and questionable pulmonary embolism on CT chest, will continue anticoagulation with IV  Heparin, patient will need invasive procedures on this admission. Continue oxymetry monitoring and supplemental 02 per [place].    4. Hypokalemia. Admission K at 2,8, has received 70 meq, will check BMP this pm and continue to replete, target K at 4,0. Renal function with cr at 0.88, will continue saline at 75 cc/ HR   DVT prophylaxis: IV heparin  Code Status: Full  Family Communication: I spoke with patient's family at the bedside and all questions were addressed.  Disposition Plan: Home    Consultants:   Pulmonary   IR  Procedures:    Antimicrobials:   Cefepime  Azithromycin     Subjective: Patient dyspnea still present, moderate in intensity, associated with cough, no fever or chills, no nausea or vomiting.   Objective: Vitals:   12/11/16 0230 12/11/16 0330 12/11/16 0457 12/11/16 0458  BP: 99/78 125/69  138/83  Pulse: 90 86  95  Resp: (!) 23 (!) 26  19  Temp:    98.6 F (37 C)  TempSrc:      SpO2: 100% 99%  91%  Weight:   111.9 kg (246 lb 12.8 oz)   Height:   5\' 5"  (1.651 m)    No intake or output data in the 24 hours ending 12/11/16 1120 Filed Weights   12/11/16 0224 12/11/16 0457  Weight: 112.5 kg (248 lb 2 oz) 111.9 kg (246 lb 12.8 oz)    Examination:  General exam: deconditioned E ENT: mild pallor, no icterus Respiratory  system: decreased breath sounds on the left, no wheezing or rhonchi on the right.  Cardiovascular system: S1 & S2 heard, RRR. No JVD, murmurs, rubs, gallops or clicks. No pedal edema. Gastrointestinal system: Abdomen is nondistended, soft and nontender. No organomegaly or masses felt. Normal bowel sounds heard. Central nervous system: Alert and  oriented. No focal neurological deficits. Extremities: Symmetric 5 x 5 power. Skin: No rashes, lesions or ulcers     Data Reviewed: I have personally reviewed following labs and imaging studies  CBC:  Recent Labs Lab 12/10/16 2214 12/11/16 0307  WBC 12.4* 10.6*  NEUTROABS 10.4*  --   HGB 13.0 12.0  HCT 41.3 37.6  MCV 84.8 83.2  PLT 451* 211   Basic Metabolic Panel:  Recent Labs Lab 12/10/16 2214 12/11/16 0307  NA 135 132*  K 2.8* 2.8*  CL 95* 94*  CO2 23 22  GLUCOSE 102* 308*  BUN 10 8  CREATININE 1.06* 0.88  CALCIUM 9.4 8.5*  MG  --  1.3*   GFR: Estimated Creatinine Clearance: 76.3 mL/min (by C-G formula based on SCr of 0.88 mg/dL). Liver Function Tests:  Recent Labs Lab 12/10/16 2214 12/11/16 0307  AST 37 29  ALT 14 12*  ALKPHOS 66 54  BILITOT 1.8* 1.5*  PROT 7.7 6.6  ALBUMIN 3.3* 2.8*   No results for input(s): LIPASE, AMYLASE in the last 168 hours. No results for input(s): AMMONIA in the last 168 hours. Coagulation Profile:  Recent Labs Lab 12/11/16 0307  INR 1.33   Cardiac Enzymes:  Recent Labs Lab 12/10/16 2214  TROPONINI <0.03   BNP (last 3 results) No results for input(s): PROBNP in the last 8760 hours. HbA1C: No results for input(s): HGBA1C in the last 72 hours. CBG:  Recent Labs Lab 12/11/16 0818  GLUCAP 113*   Lipid Profile: No results for input(s): CHOL, HDL, LDLCALC, TRIG, CHOLHDL, LDLDIRECT in the last 72 hours. Thyroid Function Tests: No results for input(s): TSH, T4TOTAL, FREET4, T3FREE, THYROIDAB in the last 72 hours. Anemia Panel: No results for input(s): VITAMINB12, FOLATE, FERRITIN, TIBC, IRON, RETICCTPCT in the last 72 hours. Sepsis Labs:  Recent Labs Lab 12/10/16 2230 12/11/16 0155 12/11/16 0307  PROCALCITON  --   --  <0.10  LATICACIDVEN 3.00* 2.38* 1.7    Recent Results (from the past 240 hour(s))  Blood culture (routine x 2)     Status: Abnormal (Preliminary result)   Collection Time: 12/10/16  10:50 PM  Result Value Ref Range Status   Specimen Description BLOOD RIGHT HAND  Final   Special Requests IN PEDIATRIC BOTTLE Blood Culture adequate volume  Final   Culture  Setup Time (A)  Final    GRAM VARIABLE ROD IN PEDIATRIC BOTTLE Organism ID to follow    Culture PENDING  Incomplete   Report Status PENDING  Incomplete         Radiology Studies: Dg Chest 2 View  Result Date: 12/10/2016 CLINICAL DATA:  Cough and nasal congestion for 1 month. EXAM: CHEST  2 VIEW COMPARISON:  None. FINDINGS: There is complete opacification of the left hemithorax. No right-sided pneumothorax. The visualized right lung is clear. The cardiomediastinal silhouette is partially obscured by the left-sided opacification but no abnormalities are seen. IMPRESSION: Complete opacification of the left hemithorax. A CT scan could further evaluate if clinically warranted. Electronically Signed   By: Dorise Bullion III M.D   On: 12/10/2016 22:18   Ct Angio Chest Pe W Or Wo Contrast  Result Date: 12/11/2016 CLINICAL  DATA:  Follow-up abnormal chest x-ray EXAM: CT ANGIOGRAPHY CHEST WITH CONTRAST TECHNIQUE: Multidetector CT imaging of the chest was performed using the standard protocol during bolus administration of intravenous contrast. Multiplanar CT image reconstructions and MIPs were obtained to evaluate the vascular anatomy. CONTRAST:  80 mL Isovue 370 intravenous COMPARISON:  Radiograph 12/10/2016 FINDINGS: Cardiovascular: Excessive respiratory motion artifact limits the examination. No central embolus is visualized. Tiny hypodensities within sub segmental right lower lobe branch vessels could be due to artifact although tiny emboli are not excluded. Non aneurysmal aorta. Atherosclerosis. No dissection is seen. There is narrowing of the left pulmonary artery and multiple left upper lobe branch vessels due to a large mass in the lung. There is tumor in case min and narrowing of the left common carotid and subclavian  vessels. Central venous obstruction by tumor with multiple chest wall and paraspinal collateral vessels filling the SVC and brachiocephalic vessels. Mediastinum/Nodes: Midline trachea. Right lobe of thyroid within normal limits. Poorly defined left lobe of thyroid. Esophagus unremarkable. Infiltrative soft tissue mass extends into the superior mediastinum/ thoracic inlet and involves the left hilar structures. Tumor or matted adenopathy within the precarinal space. Soft tissue mass in the subcarinal region measuring at least 2.9 cm. Slight narrowed appearance of the left bronchus. Lungs/Pleura: Large left pleural effusion with mild shift to the right. There is atelectasis in the left lower lobe. Ill-defined soft tissue mass in the left upper lobe, precise measurements difficult due to the presence of adjacent pleural effusion and poorly defined appearance of the mass. Mass measures at least 8.6 cm transverse by 9.1 cm AP by 12.8 cm craniocaudad. Upper Abdomen: Surgical clips in the gallbladder fossa. No acute abnormality is seen. Musculoskeletal: No acute osseous abnormality. Review of the MIP images confirms the above findings. IMPRESSION: 1. Excessive respiratory motion artifact limits the study. No central embolus is visualized. Under filling of subsegmental right lower lobe pulmonary artery branch vessels could be secondary to artifact although small emboli are difficult to exclude. 2. Large left pleural effusion. Large left upper lobe infiltrative lung mass extending into the left apex. There is mediastinal and hilar invasion by the mass. Vascular encasement of the left pulmonary artery and upper lobe arterial branches by tumor. Tumor encasement of the great vessels of the aortic arch, primarily the left common carotid and subclavian vessels. 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 3. Soft tissue density within  the left hilus and subcarinal region could relate to additional tumor or matted adenopathy. Aortic Atherosclerosis (ICD10-I70.0). Electronically Signed   By: Donavan Foil M.D.   On: 12/11/2016 02:07        Scheduled Meds: . dextromethorphan-guaiFENesin  1 tablet Oral BID  . sodium chloride flush  3 mL Intravenous Q12H   Continuous Infusions: . sodium chloride 75 mL/hr at 12/11/16 0716  . [START ON 12/12/2016] azithromycin    . cefTRIAXone (ROCEPHIN)  IV    . heparin 1,400 Units/hr (12/11/16 0245)     LOS: 0 days      Ailyn Gladd Gerome Apley, MD [facility] Pager [phone]  If 7PM-7AM, please contact night-coverage [email or web address] Password [email or web address] 12/11/2016, 11:20 AM

## 2016-12-12 ENCOUNTER — Inpatient Hospital Stay (HOSPITAL_COMMUNITY): Payer: Medicare Other

## 2016-12-12 ENCOUNTER — Encounter (HOSPITAL_COMMUNITY): Payer: Self-pay | Admitting: General Surgery

## 2016-12-12 DIAGNOSIS — R918 Other nonspecific abnormal finding of lung field: Secondary | ICD-10-CM

## 2016-12-12 HISTORY — PX: IR THORACENTESIS ASP PLEURAL SPACE W/IMG GUIDE: IMG5380

## 2016-12-12 LAB — PROTEIN, PLEURAL OR PERITONEAL FLUID: TOTAL PROTEIN, FLUID: 3.5 g/dL

## 2016-12-12 LAB — CBC
HCT: 37.5 % (ref 36.0–46.0)
HEMOGLOBIN: 11.7 g/dL — AB (ref 12.0–15.0)
MCH: 26.1 pg (ref 26.0–34.0)
MCHC: 31.2 g/dL (ref 30.0–36.0)
MCV: 83.7 fL (ref 78.0–100.0)
Platelets: 412 10*3/uL — ABNORMAL HIGH (ref 150–400)
RBC: 4.48 MIL/uL (ref 3.87–5.11)
RDW: 15 % (ref 11.5–15.5)
WBC: 10.2 10*3/uL (ref 4.0–10.5)

## 2016-12-12 LAB — BASIC METABOLIC PANEL
ANION GAP: 11 (ref 5–15)
BUN: 7 mg/dL (ref 6–20)
CHLORIDE: 100 mmol/L — AB (ref 101–111)
CO2: 26 mmol/L (ref 22–32)
Calcium: 9 mg/dL (ref 8.9–10.3)
Creatinine, Ser: 0.7 mg/dL (ref 0.44–1.00)
GFR calc Af Amer: >60 mL/min (ref >60)
GFR calc non Af Amer: >60 mL/min (ref >60)
Glucose, Bld: 87 mg/dL (ref 65–99)
POTASSIUM: 2.9 mmol/L — AB (ref 3.5–5.1)
SODIUM: 137 mmol/L (ref 135–145)

## 2016-12-12 LAB — HEPARIN LEVEL (UNFRACTIONATED): Heparin Unfractionated: 0.38 IU/mL (ref 0.30–0.70)

## 2016-12-12 LAB — LEGIONELLA PNEUMOPHILA SEROGP 1 UR AG: L. pneumophila Serogp 1 Ur Ag: NEGATIVE

## 2016-12-12 LAB — GLUCOSE, PLEURAL OR PERITONEAL FLUID: GLUCOSE FL: 95 mg/dL

## 2016-12-12 LAB — GLUCOSE, CAPILLARY: GLUCOSE-CAPILLARY: 92 mg/dL (ref 65–99)

## 2016-12-12 LAB — MAGNESIUM: Magnesium: 1.6 mg/dL — ABNORMAL LOW (ref 1.7–2.4)

## 2016-12-12 LAB — LACTATE DEHYDROGENASE, PLEURAL OR PERITONEAL FLUID: LD FL: 394 U/L — AB (ref 3–23)

## 2016-12-12 MED ORDER — MAGNESIUM SULFATE 2 GM/50ML IV SOLN
2.0000 g | Freq: Once | INTRAVENOUS | Status: AC
  Administered 2016-12-13: 2 g via INTRAVENOUS
  Filled 2016-12-12: qty 50

## 2016-12-12 MED ORDER — LIDOCAINE HCL 1 % IJ SOLN
INTRAMUSCULAR | Status: DC | PRN
  Administered 2016-12-12: 10 mL

## 2016-12-12 MED ORDER — ENOXAPARIN SODIUM 60 MG/0.6ML ~~LOC~~ SOLN
0.5000 mg/kg | SUBCUTANEOUS | Status: DC
  Administered 2016-12-12 – 2016-12-19 (×7): 55 mg via SUBCUTANEOUS
  Filled 2016-12-12 (×8): qty 0.6

## 2016-12-12 MED ORDER — POTASSIUM CHLORIDE CRYS ER 20 MEQ PO TBCR
40.0000 meq | EXTENDED_RELEASE_TABLET | ORAL | Status: AC
  Administered 2016-12-12 (×2): 40 meq via ORAL
  Filled 2016-12-12 (×3): qty 2

## 2016-12-12 MED ORDER — LIDOCAINE HCL (PF) 1 % IJ SOLN
INTRAMUSCULAR | Status: AC
  Filled 2016-12-12: qty 30

## 2016-12-12 NOTE — Procedures (Signed)
Ultrasound-guided diagnostic and therapeutic left thoracentesis performed yielding 2 liters of serosanguineous colored fluid. No immediate complications. Follow-up chest x-ray pending.       Embree Brawley E 4:16 PM 12/12/2016

## 2016-12-12 NOTE — Progress Notes (Signed)
PROGRESS NOTE   Amanda Macias  VFI:[id number]    DOB: [birth year]    DOA: 12/10/2016  PCP: System, Pcp Not In   I have briefly reviewed patients previous medical records in [facility].  Brief Narrative:  71 year old female, former smoker, otherwise no known medical history, presented with nonproductive cough, dyspnea, generalized weakness and weight loss. CT chest showed a large left lung mass with invasion of the blood vessels, mediastinal lymph nodes and a large pleural effusion. PCCM consulted and recommended IR for thoracentesis (done) and if cytology negative then consider FOB. Status post thoracentesis 6/18 by IR.   Assessment & Plan:   Principal Problem:   Mass of left lung Active Problems:   Cough   Hypokalemia   Sepsis (HCC)   UTI (urinary tract infection)   Pleural effusion   1. Left lung mass and large left pleural effusion: Highly suspicious for bronchogenic carcinoma. Pulmonology consultation appreciated. Discussed with Dr. Vaughan Browner who recommended stopping IV heparin due to low index of suspicion for PE. Status post diagnostic and therapeutic left thoracentesis 2 L by IR on 6/18. Follow pleural fluid results. If cytology negative then bronchoscopy. Oncology consultation pending further workup. Cough may be related to lung mass. Pro-calcitonin <0.1. Low index of suspicion for pneumonia. Antibiotics discontinued for this indication. 2. Escherichia coli UTI: Continue IV cefepime pending urine culture results. 3. Bacteremia: 1/2 blood culture shows Enterobacter Cloacae and second blood culture shows Bacillus species.? Source. Continue IV cefepime. Check 2-D echo. 4. Hypokalemia/hypomagnesemia: Aggressively replace and follow. Replace magnesium. 5. Elevated d-dimer: As stated above, low index of suspicion for PE. IV heparin stopped. Check lower extremity venous Dopplers.  6. Anemia: Follow CBCs. 7. Health screening: HIV: Nonreactive.   DVT prophylaxis: Lovenox  Code  Status: Full  Family Communication: None at bedside  Disposition: DC home when medically stable  Consultants:  PCCM IR   Procedures:  Diagnostic and therapeutic ultrasound-guided left thoracentesis yielding 2 L on 12/12/16  Antimicrobials:  IV cefepime Discontinued IV Rocephin and azithromycin    Subjective: Seen this morning prior to procedures. Stated that she felt better with improvement in dyspnea. Mild cough with white sputum. No pain reported.   ROS: Denies dizziness or lightheadedness.  Objective:  Vitals:   12/12/16 1430 12/12/16 1454 12/12/16 1546 12/12/16 1558  BP:   140/83 (!) 145/77  Pulse: 88     Resp:      Temp:      TempSrc:      SpO2: (!) 88% 96%    Weight:      Height:      Temperature 98.69F, RR: 18/m  Examination:  General exam: Pleasant middle-aged female, moderately built and obese, lying comfortably supine in bed.  Respiratory system: Markedly reduced breath sounds in the left lung fields. Rest clear to auscultation. Respiratory effort normal. Cardiovascular system: S1 & S2 heard, RRR. No JVD, murmurs, rubs, gallops or clicks. No pedal edema.Telemetry: Sinus rhythm.  Gastrointestinal system: Abdomen is nondistended, soft and nontender. No organomegaly or masses felt. Normal bowel sounds heard. Central nervous system: Alert and oriented. No focal neurological deficits. Extremities: Symmetric 5 x 5 power. Skin: No rashes, lesions or ulcers Psychiatry: Judgement and insight appear normal. Mood & affect appropriate.     Data Reviewed: I have personally reviewed following labs and imaging studies  CBC:  Recent Labs Lab 12/10/16 2214 12/11/16 0307 12/12/16 0558  WBC 12.4* 10.6* 10.2  NEUTROABS 10.4*  --   --   HGB  13.0 12.0 11.7*  HCT 41.3 37.6 37.5  MCV 84.8 83.2 83.7  PLT 451* 385 161*   Basic Metabolic Panel:  Recent Labs Lab 12/10/16 2214 12/11/16 0307 12/12/16 0558  NA 135 132* 137  K 2.8* 2.8* 2.9*  CL 95* 94* 100*  CO2 23  22 26   GLUCOSE 102* 308* 87  BUN 10 8 7   CREATININE 1.06* 0.88 0.70  CALCIUM 9.4 8.5* 9.0  MG  --  1.3* 1.6*   Liver Function Tests:  Recent Labs Lab 12/10/16 2214 12/11/16 0307  AST 37 29  ALT 14 12*  ALKPHOS 66 54  BILITOT 1.8* 1.5*  PROT 7.7 6.6  ALBUMIN 3.3* 2.8*   Coagulation Profile:  Recent Labs Lab 12/11/16 0307  INR 1.33   Cardiac Enzymes:  Recent Labs Lab 12/10/16 2214  TROPONINI <0.03   HbA1C: No results for input(s): HGBA1C in the last 72 hours. CBG:  Recent Labs Lab 12/11/16 0818 12/12/16 0808  GLUCAP 113* 92    Recent Results (from the past 240 hour(s))  Urine Culture     Status: Abnormal (Preliminary result)   Collection Time: 12/10/16 10:08 PM  Result Value Ref Range Status   Specimen Description URINE, RANDOM  Final   Special Requests NONE  Final   Culture (A)  Final    >=100,000 COLONIES/mL ESCHERICHIA COLI SUSCEPTIBILITIES TO FOLLOW    Report Status PENDING  Incomplete  Blood culture (routine x 2)     Status: Abnormal (Preliminary result)   Collection Time: 12/10/16 10:15 PM  Result Value Ref Range Status   Specimen Description BLOOD RIGHT ARM  Final   Special Requests   Final    BOTTLES DRAWN AEROBIC AND ANAEROBIC Blood Culture adequate volume   Culture  Setup Time   Final    GRAM NEGATIVE RODS IN BOTH AEROBIC AND ANAEROBIC BOTTLES CRITICAL RESULT CALLED TO, READ BACK BY AND VERIFIED WITH: PHARMD M [place] [id number] MLM    Culture ENTEROBACTER CLOACAE SUSCEPTIBILITIES TO FOLLOW  (A)  Final   Report Status PENDING  Incomplete  Blood Culture ID Panel (Reflexed)     Status: Abnormal   Collection Time: 12/10/16 10:15 PM  Result Value Ref Range Status   Enterococcus species NOT DETECTED NOT DETECTED Final   Listeria monocytogenes NOT DETECTED NOT DETECTED Final   Staphylococcus species NOT DETECTED NOT DETECTED Final   Staphylococcus aureus NOT DETECTED NOT DETECTED Final   Streptococcus species NOT DETECTED NOT DETECTED  Final   Streptococcus agalactiae NOT DETECTED NOT DETECTED Final   Streptococcus pneumoniae NOT DETECTED NOT DETECTED Final   Streptococcus pyogenes NOT DETECTED NOT DETECTED Final   Acinetobacter baumannii NOT DETECTED NOT DETECTED Final   Enterobacteriaceae species DETECTED (A) NOT DETECTED Final    Comment: Enterobacteriaceae represent a large family of gram-negative bacteria, not a single organism. CRITICAL RESULT CALLED TO, READ BACK BY AND VERIFIED WITH: PHARMD M [place] [id number] MLM    Enterobacter cloacae complex DETECTED (A) NOT DETECTED Final    Comment: CRITICAL RESULT CALLED TO, READ BACK BY AND VERIFIED WITH: PHARMD M [place] [phone] MLM    Escherichia coli NOT DETECTED NOT DETECTED Final   Klebsiella oxytoca NOT DETECTED NOT DETECTED Final   Klebsiella pneumoniae NOT DETECTED NOT DETECTED Final   Proteus species NOT DETECTED NOT DETECTED Final   Serratia marcescens NOT DETECTED NOT DETECTED Final   Carbapenem resistance NOT DETECTED NOT DETECTED Final   Haemophilus influenzae NOT DETECTED NOT DETECTED Final  Neisseria meningitidis NOT DETECTED NOT DETECTED Final   Pseudomonas aeruginosa NOT DETECTED NOT DETECTED Final   Candida albicans NOT DETECTED NOT DETECTED Final   Candida glabrata NOT DETECTED NOT DETECTED Final   Candida krusei NOT DETECTED NOT DETECTED Final   Candida parapsilosis NOT DETECTED NOT DETECTED Final   Candida tropicalis NOT DETECTED NOT DETECTED Final  Blood culture (routine x 2)     Status: Abnormal (Preliminary result)   Collection Time: 12/10/16 10:50 PM  Result Value Ref Range Status   Specimen Description BLOOD RIGHT HAND  Final   Special Requests IN PEDIATRIC BOTTLE Blood Culture adequate volume  Final   Culture  Setup Time (A)  Final    GRAM VARIABLE ROD IN PEDIATRIC BOTTLE CRITICAL RESULT CALLED TO, READ BACK BY AND VERIFIED WITH: Hughie Closs, PHARMD AT 1244 12/11/16 BY  L BENFIELD    Culture (A)  Final    BACILLUS  SPECIES Standardized susceptibility testing for this organism is not available.    Report Status PENDING  Incomplete  Blood Culture ID Panel (Reflexed)     Status: None   Collection Time: 12/10/16 10:50 PM  Result Value Ref Range Status   Enterococcus species NOT DETECTED NOT DETECTED Final   Listeria monocytogenes NOT DETECTED NOT DETECTED Final   Staphylococcus species NOT DETECTED NOT DETECTED Final   Staphylococcus aureus NOT DETECTED NOT DETECTED Final   Streptococcus species NOT DETECTED NOT DETECTED Final   Streptococcus agalactiae NOT DETECTED NOT DETECTED Final   Streptococcus pneumoniae NOT DETECTED NOT DETECTED Final   Streptococcus pyogenes NOT DETECTED NOT DETECTED Final   Acinetobacter baumannii NOT DETECTED NOT DETECTED Final   Enterobacteriaceae species NOT DETECTED NOT DETECTED Final   Enterobacter cloacae complex NOT DETECTED NOT DETECTED Final   Escherichia coli NOT DETECTED NOT DETECTED Final   Klebsiella oxytoca NOT DETECTED NOT DETECTED Final   Klebsiella pneumoniae NOT DETECTED NOT DETECTED Final   Proteus species NOT DETECTED NOT DETECTED Final   Serratia marcescens NOT DETECTED NOT DETECTED Final   Haemophilus influenzae NOT DETECTED NOT DETECTED Final   Neisseria meningitidis NOT DETECTED NOT DETECTED Final   Pseudomonas aeruginosa NOT DETECTED NOT DETECTED Final   Candida albicans NOT DETECTED NOT DETECTED Final   Candida glabrata NOT DETECTED NOT DETECTED Final   Candida krusei NOT DETECTED NOT DETECTED Final   Candida parapsilosis NOT DETECTED NOT DETECTED Final   Candida tropicalis NOT DETECTED NOT DETECTED Final         Radiology Studies: Dg Chest 1 View  Result Date: 12/12/2016 CLINICAL DATA:  Thoracentesis. EXAM: CHEST 1 VIEW COMPARISON:  Ultrasound same day. CT chest 12/11/2016. Chest x-ray 12/10/2016. FINDINGS: Interim decrease in left pleural effusion. No pneumothorax post thoracentesis . Prominent persistent opacification of the left  hemithorax remains. IMPRESSION: Interim decrease in left-sided pleural effusion. No pneumothorax following left-sided thoracentesis. Electronically Signed   By: Marcello Moores  Register   On: 12/12/2016 16:33   Dg Chest 2 View  Result Date: 12/10/2016 CLINICAL DATA:  Cough and nasal congestion for 1 month. EXAM: CHEST  2 VIEW COMPARISON:  None. FINDINGS: There is complete opacification of the left hemithorax. No right-sided pneumothorax. The visualized right lung is clear. The cardiomediastinal silhouette is partially obscured by the left-sided opacification but no abnormalities are seen. IMPRESSION: Complete opacification of the left hemithorax. A CT scan could further evaluate if clinically warranted. Electronically Signed   By: Dorise Bullion III M.D   On: 12/10/2016 22:18   Ct Angio Chest  Pe W Or Wo Contrast  Result Date: 12/11/2016 CLINICAL DATA:  Follow-up abnormal chest x-ray EXAM: CT ANGIOGRAPHY CHEST WITH CONTRAST TECHNIQUE: Multidetector CT imaging of the chest was performed using the standard protocol during bolus administration of intravenous contrast. Multiplanar CT image reconstructions and MIPs were obtained to evaluate the vascular anatomy. CONTRAST:  80 mL Isovue 370 intravenous COMPARISON:  Radiograph 12/10/2016 FINDINGS: Cardiovascular: Excessive respiratory motion artifact limits the examination. No central embolus is visualized. Tiny hypodensities within sub segmental right lower lobe branch vessels could be due to artifact although tiny emboli are not excluded. Non aneurysmal aorta. Atherosclerosis. No dissection is seen. There is narrowing of the left pulmonary artery and multiple left upper lobe branch vessels due to a large mass in the lung. There is tumor in case min and narrowing of the left common carotid and subclavian vessels. Central venous obstruction by tumor with multiple chest wall and paraspinal collateral vessels filling the SVC and brachiocephalic vessels. Mediastinum/Nodes:  Midline trachea. Right lobe of thyroid within normal limits. Poorly defined left lobe of thyroid. Esophagus unremarkable. Infiltrative soft tissue mass extends into the superior mediastinum/ thoracic inlet and involves the left hilar structures. Tumor or matted adenopathy within the precarinal space. Soft tissue mass in the subcarinal region measuring at least 2.9 cm. Slight narrowed appearance of the left bronchus. Lungs/Pleura: Large left pleural effusion with mild shift to the right. There is atelectasis in the left lower lobe. Ill-defined soft tissue mass in the left upper lobe, precise measurements difficult due to the presence of adjacent pleural effusion and poorly defined appearance of the mass. Mass measures at least 8.6 cm transverse by 9.1 cm AP by 12.8 cm craniocaudad. Upper Abdomen: Surgical clips in the gallbladder fossa. No acute abnormality is seen. Musculoskeletal: No acute osseous abnormality. Review of the MIP images confirms the above findings. IMPRESSION: 1. Excessive respiratory motion artifact limits the study. No central embolus is visualized. Under filling of subsegmental right lower lobe pulmonary artery branch vessels could be secondary to artifact although small emboli are difficult to exclude. 2. Large left pleural effusion. Large left upper lobe infiltrative lung mass extending into the left apex. There is mediastinal and hilar invasion by the mass. Vascular encasement of the left pulmonary artery and upper lobe arterial branches by tumor. Tumor encasement of the great vessels of the aortic arch, primarily the left common carotid and subclavian vessels. 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 3. Soft tissue density within the left hilus and subcarinal region could relate to additional tumor or matted adenopathy. Aortic Atherosclerosis (ICD10-I70.0). Electronically Signed   By: Donavan Foil M.D.   On: 12/11/2016 02:07   Ir Thoracentesis Asp Pleural Space W/img Guide  Result Date: 12/12/2016 INDICATION: New left-sided pleural effusion with possible left lung mass. Request is made for diagnostic and therapeutic thoracentesis. EXAM: ULTRASOUND GUIDED DIAGNOSTIC AND THERAPEUTIC THORACENTESIS MEDICATIONS: 1% lidocaine COMPLICATIONS: None immediate. PROCEDURE: An ultrasound guided thoracentesis was thoroughly discussed with the patient and questions answered. The benefits, risks, alternatives and complications were also discussed. The patient understands and wishes to proceed with the procedure. Written consent was obtained. Ultrasound was performed to localize and mark an adequate pocket of fluid in the left chest. The area was then prepped and draped in the normal sterile fashion. 1% Lidocaine was used for local anesthesia. Under ultrasound guidance a Safe-T-Centesis catheter was introduced. Thoracentesis was performed. The catheter was  removed and a dressing applied. FINDINGS: A total of approximately 2 L of serosanguineous fluid was removed. Samples were sent to the laboratory as requested by the clinical team. IMPRESSION: Successful ultrasound guided left thoracentesis yielding 2 L of pleural fluid. Read by: Saverio Danker, PA-C Electronically Signed   By: Marybelle Killings M.D.   On: 12/12/2016 16:46        Scheduled Meds: . dextromethorphan-guaiFENesin  1 tablet Oral BID  . enoxaparin (LOVENOX) injection  0.5 mg/kg Subcutaneous Q24H  . lidocaine (PF)      . sodium chloride flush  3 mL Intravenous Q12H   Continuous Infusions: . sodium chloride 75 mL/hr at 12/12/16 0615  . azithromycin Stopped (12/12/16 0325)  . ceFEPime (MAXIPIME) IV Stopped (12/12/16 1449)     LOS: 1 day     Caryl Manas, MD, FACP, FHM. [facility] Pager [phone]  If 7PM-7AM, please contact night-coverage [email or web address] Password [facility] 12/12/2016, 7:38 PM

## 2016-12-12 NOTE — Consult Note (Signed)
PULMONARY / CRITICAL CARE MEDICINE   Name: Amanda Macias MRN: [id number] DOB: Jun 16, 1946    ADMISSION DATE:  12/10/2016 CONSULTATION DATE:  12/12/16  REFERRING MD:  Tera Partridge, MD  CHIEF COMPLAINT:  Lung mass and pleural effusion  HISTORY OF PRESENT ILLNESS:   71 year old ex-smoker presents with 1 month of cough, white mucus, shortness of breath, weakness. She does not report any fevers, chills, hemoptysis. Review of systems is positive for weight loss and appetite. CT scan shows a large left lung mass with invasion of the blood vessels, mediastinal lymph nodes and a large left pleural effusion. PCCM consulted for further evaluation  PAST MEDICAL HISTORY :  She  has no past medical history on file.  PAST SURGICAL HISTORY: She  has a past surgical history that includes Cholecystectomy.  No Known Allergies  No current facility-administered medications on file prior to encounter.    No current outpatient prescriptions on file prior to encounter.    FAMILY HISTORY:  Her indicated that the status of her father is unknown.    SOCIAL HISTORY: She  reports that she has quit smoking. She does not have any smokeless tobacco history on file. She reports that she does not drink alcohol or use drugs.  REVIEW OF SYSTEMS:   As above Complains of cough, white sputum, dyspnea No fevers, chills, hemoptysis No chest pain, palpitations  SUBJECTIVE:    VITAL SIGNS: BP (!) 130/98 (BP Location: Right Arm)   Pulse 91   Temp 98.3 F (36.8 C) (Oral)   Resp 18   Ht 5\' 5"  (1.651 m)   Wt 246 lb 12.8 oz (111.9 kg)   SpO2 92%   BMI 41.07 kg/m   HEMODYNAMICS:    VENTILATOR SETTINGS:    INTAKE / OUTPUT: I/O last 3 completed shifts: In: 2539.5 [P.O.:118; I.V.:2071.5; IV Piggyback:350] Out: 200 [Urine:200]  PHYSICAL EXAMINATION: Blood pressure (!) 130/98, pulse 91, temperature 98.3 F (36.8 C), temperature source Oral, resp. rate 18, height 5\' 5"  (1.651 m), weight 246 lb 12.8 oz  (111.9 kg), SpO2 92 %. Gen:      No acute distress HEENT:  EOMI, sclera anicteric Neck:     No masses; no thyromegaly Lungs:    Diminished breath sounds on left; normal respiratory effort CV:         Regular rate and rhythm; no murmurs Abd:      + bowel sounds; soft, non-tender; no palpable masses, no distension Ext:    No edema; adequate peripheral perfusion Skin:      Warm and dry; no rash Neuro: alert and oriented x 3 Psych: normal mood and affect  LABS:  BMET  Recent Labs Lab 12/10/16 2214 12/11/16 0307 12/12/16 0558  NA 135 132* 137  K 2.8* 2.8* 2.9*  CL 95* 94* 100*  CO2 23 22 26   BUN 10 8 7   CREATININE 1.06* 0.88 0.70  GLUCOSE 102* 308* 87    Electrolytes  Recent Labs Lab 12/10/16 2214 12/11/16 0307 12/12/16 0558  CALCIUM 9.4 8.5* 9.0  MG  --  1.3*  --     CBC  Recent Labs Lab 12/10/16 2214 12/11/16 0307 12/12/16 0558  WBC 12.4* 10.6* 10.2  HGB 13.0 12.0 11.7*  HCT 41.3 37.6 37.5  PLT 451* 385 412*    Coag's  Recent Labs Lab 12/11/16 0307  APTT 42*  INR 1.33    Sepsis Markers  Recent Labs Lab 12/10/16 2230 12/11/16 0155 12/11/16 0307  LATICACIDVEN 3.00* 2.38* 1.7  PROCALCITON  --   --  <0.10    ABG No results for input(s): PHART, PCO2ART, PO2ART in the last 168 hours.  Liver Enzymes  Recent Labs Lab 12/10/16 2214 12/11/16 0307  AST 37 29  ALT 14 12*  ALKPHOS 66 54  BILITOT 1.8* 1.5*  ALBUMIN 3.3* 2.8*    Cardiac Enzymes  Recent Labs Lab 12/10/16 2214  TROPONINI <0.03    Glucose  Recent Labs Lab 12/11/16 0818 12/12/16 0808  GLUCAP 113* 92    Imaging CT chest 12/11/17 > underfilling of the right lower lobe subsegmental arteries. Large left effusion, left upper lobe infiltrate with mass with mediastinal, hilar invasion, vascular encasement. I reviewed all images personally.  STUDIES:    CULTURES:   ANTIBIOTICS: Ceftriaxone 6/17> Azithro 6/18>  SIGNIFICANT  EVENTS:   LINES/TUBES:  DISCUSSION: 71 year old ex-smoker with a large left lung mass with mediastinal, hilar, vascular involvement, large left pleural effusion. The CT images is highly suspicious for malignancy, possible small cell carcinoma. I'm not very concerned that there is a PE on the right lower lobe. Patient does not have any hemodynamic or respiratory compromise at present  - Okay to stop heparin gtt, check lower extremity Dopplers, echocardiogram - Contact IR for left thoracentesis of let effusion as a first step - If pleural fluid cytology is negative then we may consider bronchoscopy for further evaluation.  Discussed with the patient and Dr. Algis Liming.  More then 1/2 the time of the 40 min visit was spent in counseling and/or coordination of care with the patient and family and hospitalist team. Marshell Garfinkel MD [facility] Pager [phone] If no answer or after 3pm call: [phone] 12/12/2016, 10:17 AM

## 2016-12-12 NOTE — Progress Notes (Signed)
Freda Munro from lab called back to state that the lab ordered, flow cytometry, can be tested on pleural fluid.

## 2016-12-13 ENCOUNTER — Inpatient Hospital Stay (HOSPITAL_COMMUNITY): Payer: Medicare Other

## 2016-12-13 ENCOUNTER — Encounter (HOSPITAL_COMMUNITY): Payer: Self-pay | Admitting: General Practice

## 2016-12-13 DIAGNOSIS — M7989 Other specified soft tissue disorders: Secondary | ICD-10-CM

## 2016-12-13 LAB — BASIC METABOLIC PANEL
ANION GAP: 10 (ref 5–15)
BUN: 6 mg/dL (ref 6–20)
CALCIUM: 9 mg/dL (ref 8.9–10.3)
CO2: 26 mmol/L (ref 22–32)
CREATININE: 0.67 mg/dL (ref 0.44–1.00)
Chloride: 102 mmol/L (ref 101–111)
Glucose, Bld: 90 mg/dL (ref 65–99)
Potassium: 3.4 mmol/L — ABNORMAL LOW (ref 3.5–5.1)
SODIUM: 138 mmol/L (ref 135–145)

## 2016-12-13 LAB — CULTURE, BLOOD (ROUTINE X 2)
SPECIAL REQUESTS: ADEQUATE
Special Requests: ADEQUATE

## 2016-12-13 LAB — URINE CULTURE

## 2016-12-13 LAB — PH, BODY FLUID: pH, Body Fluid: 7.6

## 2016-12-13 LAB — CBC
HEMATOCRIT: 38.1 % (ref 36.0–46.0)
Hemoglobin: 12 g/dL (ref 12.0–15.0)
MCH: 26.4 pg (ref 26.0–34.0)
MCHC: 31.5 g/dL (ref 30.0–36.0)
MCV: 83.7 fL (ref 78.0–100.0)
PLATELETS: 387 10*3/uL (ref 150–400)
RBC: 4.55 MIL/uL (ref 3.87–5.11)
RDW: 15.2 % (ref 11.5–15.5)
WBC: 8 10*3/uL (ref 4.0–10.5)

## 2016-12-13 LAB — MAGNESIUM: MAGNESIUM: 2.1 mg/dL (ref 1.7–2.4)

## 2016-12-13 LAB — GRAM STAIN

## 2016-12-13 MED ORDER — POTASSIUM CHLORIDE CRYS ER 20 MEQ PO TBCR
40.0000 meq | EXTENDED_RELEASE_TABLET | Freq: Once | ORAL | Status: AC
  Administered 2016-12-13: 40 meq via ORAL
  Filled 2016-12-13: qty 2

## 2016-12-13 NOTE — Progress Notes (Addendum)
PROGRESS NOTE   Amanda Macias  FAO:[id number]    DOB: 10-16-1945    DOA: 12/10/2016  PCP: System, Pcp Not In   I have briefly reviewed patients previous medical records in [facility].  Brief Narrative:  71 year old female, former smoker, otherwise no known medical history, presented with nonproductive cough, dyspnea, generalized weakness and weight loss. CT chest showed a large left lung mass with invasion of the blood vessels, mediastinal lymph nodes and a large pleural effusion. PCCM consulted and recommended IR for thoracentesis (done) and if cytology negative then consider FOB. Status post thoracentesis 6/18 by IR.   Assessment & Plan:   Principal Problem:   Mass of left lung Active Problems:   Cough   Hypokalemia   Sepsis (HCC)   UTI (urinary tract infection)   Pleural effusion  Left lung mass and large left pleural effusion:  - Highly suspicious for bronchogenic carcinoma. Pulmonology consultation appreciated. - status post thoracentesis 6/18 with 2 L drained, looks exudative. - Discussed with pathology, cytology looks benign, discussed with pulmonary, at this point they want to hold on bronchoscopy in the setting of bacteremia, and reassess your further imaging when more stable.  Escherichia coli UTI:  - Continue IV cefepime , urine culture growing Escherichia coli  Bacteremia:  -1 blood culture growing gram variable rods bacillus which is very likely contaminant, another blood culture growing Enterobacter colace, unclear source of infection, most likely pulmonary in the setting of abnormal CT findings, follow sputum cultures, follow on pleural effusion cultures as well. - Continue with IV cefepime, will repeat blood cultures.  Hypokalemia/hypomagnesemia:  - Repleting, potassium 3.4 today, magnesium is 2.1, continue with potassium oral supplement Aggressively replace and follow. Replace magnesium.  Elevated d-dimer:  -Pulmonary input appreciated, low index of  suspicion for PE. IV heparin stopped. Check lower extremity venous Dopplers.   Anemia:  - Follow CBCs.   Health screening: HIV: Nonreactive.   DVT prophylaxis: Lovenox  Code Status: Full  Family Communication: None at bedside  Disposition: DC home when medically stable  Consultants:  PCCM IR   Procedures:  Diagnostic and therapeutic ultrasound-guided left thoracentesis yielding 2 L on 12/12/16  Antimicrobials:  IV cefepime Discontinued IV Rocephin and azithromycin    Subjective: Seen this morning prior to procedures. Stated that she felt better with improvement in dyspnea. Mild cough with white sputum. No pain reported.   ROS: Denies dizziness or lightheadedness.  Objective:  Vitals:   12/12/16 1546 12/12/16 1558 12/12/16 2236 12/13/16 0558  BP: 140/83 (!) 145/77 125/84 131/79  Pulse:   92 85  Resp:   18 18  Temp:   98.7 F (37.1 C) 98.5 F (36.9 C)  TempSrc:   Oral Oral  SpO2:   90% 92%  Weight:      Height:      Temperature 98.62F, RR: 18/m  Examination:  Awake alert oriented 3, laying in bed in no apparent distress  Diminished air entry at the bases, otherwise clear to auscultation, no use of accessory muscle  S1 ,S2 heard, no rubs or gallops, regular rate and rhythm  Abdomen is nondistended, soft and nontender. No organomegaly or masses felt. Normal bowel sounds heard. No focal neurological deficits.  Judgement and insight appear normal. Mood & affect appropriate.     Data Reviewed: I have personally reviewed following labs and imaging studies  CBC:  Recent Labs Lab 12/10/16 2214 12/11/16 0307 12/12/16 0558 12/13/16 0526  WBC 12.4* 10.6* 10.2 8.0  NEUTROABS  10.4*  --   --   --   HGB 13.0 12.0 11.7* 12.0  HCT 41.3 37.6 37.5 38.1  MCV 84.8 83.2 83.7 83.7  PLT 451* 385 412* 237   Basic Metabolic Panel:  Recent Labs Lab 12/10/16 2214 12/11/16 0307 12/12/16 0558 12/13/16 0526  NA 135 132* 137 138  K 2.8* 2.8* 2.9* 3.4*  CL 95* 94*  100* 102  CO2 23 22 26 26   GLUCOSE 102* 308* 87 90  BUN 10 8 7 6   CREATININE 1.06* 0.88 0.70 0.67  CALCIUM 9.4 8.5* 9.0 9.0  MG  --  1.3* 1.6* 2.1   Liver Function Tests:  Recent Labs Lab 12/10/16 2214 12/11/16 0307  AST 37 29  ALT 14 12*  ALKPHOS 66 54  BILITOT 1.8* 1.5*  PROT 7.7 6.6  ALBUMIN 3.3* 2.8*   Coagulation Profile:  Recent Labs Lab 12/11/16 0307  INR 1.33   Cardiac Enzymes:  Recent Labs Lab 12/10/16 2214  TROPONINI <0.03   HbA1C: No results for input(s): HGBA1C in the last 72 hours. CBG:  Recent Labs Lab 12/11/16 0818 12/12/16 0808  GLUCAP 113* 92    Recent Results (from the past 240 hour(s))  Urine Culture     Status: Abnormal   Collection Time: 12/10/16 10:08 PM  Result Value Ref Range Status   Specimen Description URINE, RANDOM  Final   Special Requests NONE  Final   Culture >=100,000 COLONIES/mL ESCHERICHIA COLI (A)  Final   Report Status 12/13/2016 FINAL  Final   Organism ID, Bacteria ESCHERICHIA COLI (A)  Final      Susceptibility   Escherichia coli - MIC*    AMPICILLIN >=32 RESISTANT Resistant     CEFAZOLIN <=4 SENSITIVE Sensitive     CEFTRIAXONE <=1 SENSITIVE Sensitive     CIPROFLOXACIN <=0.25 SENSITIVE Sensitive     GENTAMICIN <=1 SENSITIVE Sensitive     IMIPENEM <=0.25 SENSITIVE Sensitive     NITROFURANTOIN <=16 SENSITIVE Sensitive     TRIMETH/SULFA <=20 SENSITIVE Sensitive     AMPICILLIN/SULBACTAM 16 INTERMEDIATE Intermediate     PIP/TAZO <=4 SENSITIVE Sensitive     Extended ESBL NEGATIVE Sensitive     * >=100,000 COLONIES/mL ESCHERICHIA COLI  Blood culture (routine x 2)     Status: Abnormal   Collection Time: 12/10/16 10:15 PM  Result Value Ref Range Status   Specimen Description BLOOD RIGHT ARM  Final   Special Requests   Final    BOTTLES DRAWN AEROBIC AND ANAEROBIC Blood Culture adequate volume   Culture  Setup Time   Final    GRAM NEGATIVE RODS IN BOTH AEROBIC AND ANAEROBIC BOTTLES CRITICAL RESULT CALLED TO, READ  BACK BY AND VERIFIED WITH: Ellin Mayhew Lena [phone] MLM    Culture ENTEROBACTER CLOACAE (A)  Final   Report Status 12/13/2016 FINAL  Final   Organism ID, Bacteria ENTEROBACTER CLOACAE  Final      Susceptibility   Enterobacter cloacae - MIC*    CEFAZOLIN >=64 RESISTANT Resistant     CEFEPIME <=1 SENSITIVE Sensitive     CEFTAZIDIME <=1 SENSITIVE Sensitive     CEFTRIAXONE <=1 SENSITIVE Sensitive     CIPROFLOXACIN <=0.25 SENSITIVE Sensitive     GENTAMICIN <=1 SENSITIVE Sensitive     IMIPENEM <=0.25 SENSITIVE Sensitive     TRIMETH/SULFA <=20 SENSITIVE Sensitive     PIP/TAZO <=4 SENSITIVE Sensitive     * ENTEROBACTER CLOACAE  Blood Culture ID Panel (Reflexed)     Status: Abnormal  Collection Time: 12/10/16 10:15 PM  Result Value Ref Range Status   Enterococcus species NOT DETECTED NOT DETECTED Final   Listeria monocytogenes NOT DETECTED NOT DETECTED Final   Staphylococcus species NOT DETECTED NOT DETECTED Final   Staphylococcus aureus NOT DETECTED NOT DETECTED Final   Streptococcus species NOT DETECTED NOT DETECTED Final   Streptococcus agalactiae NOT DETECTED NOT DETECTED Final   Streptococcus pneumoniae NOT DETECTED NOT DETECTED Final   Streptococcus pyogenes NOT DETECTED NOT DETECTED Final   Acinetobacter baumannii NOT DETECTED NOT DETECTED Final   Enterobacteriaceae species DETECTED (A) NOT DETECTED Final    Comment: Enterobacteriaceae represent a large family of gram-negative bacteria, not a single organism. CRITICAL RESULT CALLED TO, READ BACK BY AND VERIFIED WITH: PHARMD M Cross [phone] MLM    Enterobacter cloacae complex DETECTED (A) NOT DETECTED Final    Comment: CRITICAL RESULT CALLED TO, READ BACK BY AND VERIFIED WITH: PHARMD M Leonard [phone] MLM    Escherichia coli NOT DETECTED NOT DETECTED Final   Klebsiella oxytoca NOT DETECTED NOT DETECTED Final   Klebsiella pneumoniae NOT DETECTED NOT DETECTED Final   Proteus species NOT DETECTED NOT DETECTED Final    Serratia marcescens NOT DETECTED NOT DETECTED Final   Carbapenem resistance NOT DETECTED NOT DETECTED Final   Haemophilus influenzae NOT DETECTED NOT DETECTED Final   Neisseria meningitidis NOT DETECTED NOT DETECTED Final   Pseudomonas aeruginosa NOT DETECTED NOT DETECTED Final   Candida albicans NOT DETECTED NOT DETECTED Final   Candida glabrata NOT DETECTED NOT DETECTED Final   Candida krusei NOT DETECTED NOT DETECTED Final   Candida parapsilosis NOT DETECTED NOT DETECTED Final   Candida tropicalis NOT DETECTED NOT DETECTED Final  Blood culture (routine x 2)     Status: Abnormal   Collection Time: 12/10/16 10:50 PM  Result Value Ref Range Status   Specimen Description BLOOD RIGHT HAND  Final   Special Requests IN PEDIATRIC BOTTLE Blood Culture adequate volume  Final   Culture  Setup Time (A)  Final    GRAM VARIABLE ROD IN PEDIATRIC BOTTLE CRITICAL RESULT CALLED TO, READ BACK BY AND VERIFIED WITH: Hughie Closs, PHARMD AT 1244 12/11/16 BY  L BENFIELD    Culture (A)  Final    BACILLUS SPECIES Standardized susceptibility testing for this organism is not available.    Report Status 12/13/2016 FINAL  Final  Blood Culture ID Panel (Reflexed)     Status: None   Collection Time: 12/10/16 10:50 PM  Result Value Ref Range Status   Enterococcus species NOT DETECTED NOT DETECTED Final   Listeria monocytogenes NOT DETECTED NOT DETECTED Final   Staphylococcus species NOT DETECTED NOT DETECTED Final   Staphylococcus aureus NOT DETECTED NOT DETECTED Final   Streptococcus species NOT DETECTED NOT DETECTED Final   Streptococcus agalactiae NOT DETECTED NOT DETECTED Final   Streptococcus pneumoniae NOT DETECTED NOT DETECTED Final   Streptococcus pyogenes NOT DETECTED NOT DETECTED Final   Acinetobacter baumannii NOT DETECTED NOT DETECTED Final   Enterobacteriaceae species NOT DETECTED NOT DETECTED Final   Enterobacter cloacae complex NOT DETECTED NOT DETECTED Final   Escherichia coli NOT DETECTED NOT  DETECTED Final   Klebsiella oxytoca NOT DETECTED NOT DETECTED Final   Klebsiella pneumoniae NOT DETECTED NOT DETECTED Final   Proteus species NOT DETECTED NOT DETECTED Final   Serratia marcescens NOT DETECTED NOT DETECTED Final   Haemophilus influenzae NOT DETECTED NOT DETECTED Final   Neisseria meningitidis NOT DETECTED NOT DETECTED Final   Pseudomonas aeruginosa  NOT DETECTED NOT DETECTED Final   Candida albicans NOT DETECTED NOT DETECTED Final   Candida glabrata NOT DETECTED NOT DETECTED Final   Candida krusei NOT DETECTED NOT DETECTED Final   Candida parapsilosis NOT DETECTED NOT DETECTED Final   Candida tropicalis NOT DETECTED NOT DETECTED Final  Gram stain     Status: None   Collection Time: 12/12/16  4:16 PM  Result Value Ref Range Status   Specimen Description PLEURAL LEFT  Final   Special Requests NONE  Final   Gram Stain   Final    FEW WBC PRESENT, PREDOMINANTLY MONONUCLEAR NO ORGANISMS SEEN    Report Status 12/13/2016 FINAL  Final         Radiology Studies: Dg Chest 1 View  Result Date: 12/12/2016 CLINICAL DATA:  Thoracentesis. EXAM: CHEST 1 VIEW COMPARISON:  Ultrasound same day. CT chest 12/11/2016. Chest x-ray 12/10/2016. FINDINGS: Interim decrease in left pleural effusion. No pneumothorax post thoracentesis . Prominent persistent opacification of the left hemithorax remains. IMPRESSION: Interim decrease in left-sided pleural effusion. No pneumothorax following left-sided thoracentesis. Electronically Signed   By: Marcello Moores  Register   On: 12/12/2016 16:33   Ir Thoracentesis Asp Pleural Space W/img Guide  Result Date: 12/12/2016 INDICATION: New left-sided pleural effusion with possible left lung mass. Request is made for diagnostic and therapeutic thoracentesis. EXAM: ULTRASOUND GUIDED DIAGNOSTIC AND THERAPEUTIC THORACENTESIS MEDICATIONS: 1% lidocaine COMPLICATIONS: None immediate. PROCEDURE: An ultrasound guided thoracentesis was thoroughly discussed with the patient and  questions answered. The benefits, risks, alternatives and complications were also discussed. The patient understands and wishes to proceed with the procedure. Written consent was obtained. Ultrasound was performed to localize and mark an adequate pocket of fluid in the left chest. The area was then prepped and draped in the normal sterile fashion. 1% Lidocaine was used for local anesthesia. Under ultrasound guidance a Safe-T-Centesis catheter was introduced. Thoracentesis was performed. The catheter was removed and a dressing applied. FINDINGS: A total of approximately 2 L of serosanguineous fluid was removed. Samples were sent to the laboratory as requested by the clinical team. IMPRESSION: Successful ultrasound guided left thoracentesis yielding 2 L of pleural fluid. Read by: Saverio Danker, PA-C Electronically Signed   By: Marybelle Killings M.D.   On: 12/12/2016 16:46        Scheduled Meds: . dextromethorphan-guaiFENesin  1 tablet Oral BID  . enoxaparin (LOVENOX) injection  0.5 mg/kg Subcutaneous Q24H  . sodium chloride flush  3 mL Intravenous Q12H   Continuous Infusions: . sodium chloride 1,000 mL (12/12/16 2246)  . azithromycin Stopped (12/13/16 0521)  . ceFEPime (MAXIPIME) IV Stopped (12/13/16 0753)     LOS: 2 days     Phillips Climes, MD [facility] Pager [phone]  If 7PM-7AM, please contact night-coverage [email or web address] Password [facility] 12/13/2016, 12:53 PM

## 2016-12-13 NOTE — Progress Notes (Signed)
VASCULAR LAB PRELIMINARY  PRELIMINARY  PRELIMINARY  PRELIMINARY  Bilateral lower extremity venous duplex completed.    Preliminary report:  Bilateral:  No evidence of DVT, superficial thrombosis, or Baker's Cyst.   Amanda Macias, RVS 12/13/2016, 1:44 PM

## 2016-12-13 NOTE — Evaluation (Signed)
Physical Therapy Evaluation Patient Details Name: Amanda Macias MRN: [id number] DOB: [birth year] Today's Date: 12/13/2016   History of Present Illness  Pt is a 71 y/o female admitted secondary to L lung mass, UTI,  and hypokalemia. Pt had persistent cough, nasal congestions, fatigue, nausea, diarrhea, and fever. PMH includes former smoker.   Clinical Impression  Pt admitted secondary to problem above with deficits below. PTA, pt was ambulating with cane, however, reports she had difficulty with performing IADLs and ADLs as well as mobility tasks. Upon evaluation, pt limited by decreased cardioplumonary endurance, weakness, fatigue, and decreased balance. Pt requiring min to mod A for functional mobility tasks. Reports she does not feel safe to go home and currently lives alone. Recommending SNF at d/c to increase functional mobility independence. Recommending OT consult to assess safety with ADL tasks. Will continue to follow acutely to maximize functional mobility independence.     Follow Up Recommendations SNF    Equipment Recommendations  Rolling walker with 5" wheels;3in1 (PT)    Recommendations for Other Services OT consult     Precautions / Restrictions Precautions Precautions: Fall Precaution Comments: Watch O2 sats  Restrictions Weight Bearing Restrictions: No      Mobility  Bed Mobility Overal bed mobility: Needs Assistance Bed Mobility: Supine to Sit;Sit to Supine     Supine to sit: Min assist Sit to supine: Min assist   General bed mobility comments: Min A for trunk management during bed mobility. Oxygen sats monitored; dropped to 88% on RA upon sitting, however, elevated to 90% with seated rest.   Transfers Overall transfer level: Needs assistance Equipment used: Rolling walker (2 wheeled) Transfers: Sit to/from Stand Sit to Stand: Mod assist         General transfer comment: Required 2 attempts to stand. Mod A for lift assist and steadying upon standing.  Verbal cues for safe hand placement.   Ambulation/Gait Ambulation/Gait assistance: Min assist Ambulation Distance (Feet): 15 Feet Assistive device: Rolling walker (2 wheeled) Gait Pattern/deviations: Step-through pattern;Decreased stride length;Trunk flexed Gait velocity: Decreased Gait velocity interpretation: Below normal speed for age/gender General Gait Details: Slow, unsteady gait. Required min A with use of RW. Checked oxygen sats and dropped to 82%, however, switched fingers for pulse ox placement, and oxygen sats at 88%. Required cues for pursed lip breathing to increase to 91%. Pt limited by SOB and fatigue.   Stairs            Wheelchair Mobility    Modified Rankin (Stroke Patients Only)       Balance Overall balance assessment: Needs assistance Sitting-balance support: Feet supported;Bilateral upper extremity supported Sitting balance-Leahy Scale: Fair     Standing balance support: Bilateral upper extremity supported;During functional activity Standing balance-Leahy Scale: Poor Standing balance comment: Reliant on RW for stability.                              Pertinent Vitals/Pain Pain Assessment: No/denies pain    Home Living Family/patient expects to be discharged to:: Private residence Living Arrangements: Alone Available Help at Discharge: Family (brothers available ) Type of Home: House Home Access: Stairs to enter Entrance Stairs-Rails: None Entrance Stairs-Number of Steps: 2 Home Layout: One level Home Equipment: Cane - single point      Prior Function Level of Independence: Independent with assistive device(s)         Comments: Pt reports using a cane at baseline. Reports she had been  having increased difficulty with functional tasks at home, secondary to increased weakness and fatigue.      Hand Dominance   Dominant Hand: Right    Extremity/Trunk Assessment   Upper Extremity Assessment Upper Extremity Assessment:  Generalized weakness    Lower Extremity Assessment Lower Extremity Assessment: Generalized weakness (Grossly 3+/5 throughout )    Cervical / Trunk Assessment Cervical / Trunk Assessment: Kyphotic  Communication   Communication: No difficulties  Cognition Arousal/Alertness: Awake/alert Behavior During Therapy: WFL for tasks assessed/performed Overall Cognitive Status: Within Functional Limits for tasks assessed                                        General Comments General comments (skin integrity, edema, etc.): Pt's niece present in room during session. At beginning of session, pt requesting rehab secondary to inability to perform necessary tasks at home.     Exercises     Assessment/Plan    PT Assessment Patient needs continued PT services  PT Problem List Decreased strength;Decreased activity tolerance;Decreased balance;Decreased mobility;Decreased knowledge of use of DME;Decreased knowledge of precautions;Cardiopulmonary status limiting activity       PT Treatment Interventions DME instruction;Gait training;Stair training;Functional mobility training;Therapeutic activities;Therapeutic exercise;Balance training;Neuromuscular re-education;Patient/family education    PT Goals (Current goals can be found in the Care Plan section)  Acute Rehab PT Goals Patient Stated Goal: to go to rehab to get stronger PT Goal Formulation: With patient Time For Goal Achievement: 12/27/16 Potential to Achieve Goals: Fair    Frequency Min 2X/week   Barriers to discharge Decreased caregiver support Pt lives alone     Co-evaluation               AM-PAC PT "6 Clicks" Daily Activity  Outcome Measure Difficulty turning over in bed (including adjusting bedclothes, sheets and blankets)?: Total Difficulty moving from lying on back to sitting on the side of the bed? : Total Difficulty sitting down on and standing up from a chair with arms (e.g., wheelchair, bedside  commode, etc,.)?: Total Help needed moving to and from a bed to chair (including a wheelchair)?: A Little Help needed walking in hospital room?: A Little Help needed climbing 3-5 steps with a railing? : A Lot 6 Click Score: 11    End of Session Equipment Utilized During Treatment: Gait belt Activity Tolerance: Treatment limited secondary to medical complications (Comment);Patient limited by fatigue (decreased oxygen sats; SOB ) Patient left: in bed;with call bell/phone within reach Nurse Communication: Mobility status PT Visit Diagnosis: Unsteadiness on feet (R26.81);Other abnormalities of gait and mobility (R26.89)    Time: 1749-4496 PT Time Calculation (min) (ACUTE ONLY): 26 min   Charges:   PT Evaluation $PT Eval Moderate Complexity: 1 Procedure PT Treatments $Gait Training: 8-22 mins   PT G Codes:        Leighton Ruff, PT, DPT  Acute Rehabilitation Services  Pager: [phone]   Rudean Hitt 12/13/2016, 3:05 PM

## 2016-12-13 NOTE — Progress Notes (Signed)
PULMONARY / CRITICAL CARE MEDICINE   Name: Amanda Macias MRN: [id number] DOB: [birth year]    ADMISSION DATE:  12/10/2016 CONSULTATION DATE:  12/12/16  REFERRING MD:  Tera Partridge, MD  CHIEF COMPLAINT:  Lung mass and pleural effusion  HISTORY OF PRESENT ILLNESS:   71 year old ex-smoker presents with 1 month of cough, white mucus, shortness of breath, weakness. She does not report any fevers, chills, hemoptysis. Review of systems is positive for weight loss and appetite. CT scan shows a large left lung mass with invasion of the blood vessels, mediastinal lymph nodes and a large left pleural effusion. PCCM consulted for further evaluation  PAST MEDICAL HISTORY :  She  has no past medical history on file.  PAST SURGICAL HISTORY: She  has a past surgical history that includes Cholecystectomy and IR THORACENTESIS ASP PLEURAL SPACE W/IMG GUIDE (12/12/2016).  No Known Allergies  No current facility-administered medications on file prior to encounter.    No current outpatient prescriptions on file prior to encounter.    FAMILY HISTORY:  Her indicated that the status of her father is unknown.    SOCIAL HISTORY: She  reports that she has quit smoking. She does not have any smokeless tobacco history on file. She reports that she does not drink alcohol or use drugs.  REVIEW OF SYSTEMS:   As above Complains of cough, white sputum, dyspnea No fevers, chills, hemoptysis No chest pain, palpitations  SUBJECTIVE:    VITAL SIGNS: BP 131/79 (BP Location: Right Arm)   Pulse 85   Temp 98.5 F (36.9 C) (Oral)   Resp 18   Ht 5\' 5"  (1.651 m)   Wt 246 lb 12.8 oz (111.9 kg)   SpO2 92%   BMI 41.07 kg/m   HEMODYNAMICS:    VENTILATOR SETTINGS:    INTAKE / OUTPUT: I/O last 3 completed shifts: In: 3716.5 [P.O.:478; I.V.:2588.5; IV Piggyback:650] Out: 400 [Urine:400]  PHYSICAL EXAMINATION: Blood pressure 131/79, pulse 85, temperature 98.5 F (36.9 C), temperature source Oral, resp. rate  18, height 5\' 5"  (1.651 m), weight 246 lb 12.8 oz (111.9 kg), SpO2 92 %. Gen:      No acute distress HEENT:  EOMI, sclera anicteric Neck:     No masses; no thyromegaly Lungs:    Diminished breath sounds on left; normal respiratory effort CV:         Regular rate and rhythm; no murmurs Abd:      + bowel sounds; soft, non-tender; no palpable masses, no distension Ext:    No edema; adequate peripheral perfusion Skin:      Warm and dry; no rash Neuro: alert and oriented x 3 Psych: normal mood and affect  LABS:  BMET  Recent Labs Lab 12/11/16 0307 12/12/16 0558 12/13/16 0526  NA 132* 137 138  K 2.8* 2.9* 3.4*  CL 94* 100* 102  CO2 22 26 26   BUN 8 7 6   CREATININE 0.88 0.70 0.67  GLUCOSE 308* 87 90    Electrolytes  Recent Labs Lab 12/11/16 0307 12/12/16 0558 12/13/16 0526  CALCIUM 8.5* 9.0 9.0  MG 1.3* 1.6* 2.1    CBC  Recent Labs Lab 12/11/16 0307 12/12/16 0558 12/13/16 0526  WBC 10.6* 10.2 8.0  HGB 12.0 11.7* 12.0  HCT 37.6 37.5 38.1  PLT 385 412* 387    Coag's  Recent Labs Lab 12/11/16 0307  APTT 42*  INR 1.33    Sepsis Markers  Recent Labs Lab 12/10/16 2230 12/11/16 0155 12/11/16 0307  LATICACIDVEN 3.00*  2.38* 1.7  PROCALCITON  --   --  <0.10    ABG No results for input(s): PHART, PCO2ART, PO2ART in the last 168 hours.  Liver Enzymes  Recent Labs Lab 12/10/16 2214 12/11/16 0307  AST 37 29  ALT 14 12*  ALKPHOS 66 54  BILITOT 1.8* 1.5*  ALBUMIN 3.3* 2.8*    Cardiac Enzymes  Recent Labs Lab 12/10/16 2214  TROPONINI <0.03    Glucose  Recent Labs Lab 12/11/16 0818 12/12/16 0808  GLUCAP 113* 92    Imaging CT chest 12/11/16 > underfilling of the right lower lobe subsegmental arteries. Large left effusion, left upper lobe infiltrate with mass with mediastinal, hilar invasion, vascular encasement. CXR 12/12/16 > Mild improvement in left effusion.  I reviewed all images personally.  STUDIES:   CULTURES: Ucx 6/16 > E  coli Bcx 6/16 > E colace, bacillus  ANTIBIOTICS: Ceftriaxone 6/17 Cefepime 6/17 >> Azithro 6/18>  SIGNIFICANT EVENTS:   LINES/TUBES:  DISCUSSION: 71 year old ex-smoker with a large left lung mass with mediastinal, hilar, vascular involvement, large left pleural effusion, UTI The CT images is suspicious for malignancy, possible small cell carcinoma. Other possibilities include lt lung pneumonia with para pneumonic effusion but she does not appear infected. I'm not very concerned that there is a PE on the right lower lobe. Patient does not have any hemodynamic or respiratory compromise at present.  Noted to have e coli in urine and e cloacae and bacillus in blood.  Pleural fluid studies reviewed. It shows exudative effusion.   - Follow final pleural fluid studies. - Consider bronchoscopy if the fluid cytology is negative. - Continue antibiotics per primary tea,   We will check back once the cytology is finalized.  Marshell Garfinkel MD [place] [facility] Pager [phone] If no answer or after 3pm call: [phone] 12/13/2016, 9:48 AM

## 2016-12-14 LAB — BASIC METABOLIC PANEL
Anion gap: 14 (ref 5–15)
BUN: 5 mg/dL — ABNORMAL LOW (ref 6–20)
CALCIUM: 9 mg/dL (ref 8.9–10.3)
CO2: 21 mmol/L — AB (ref 22–32)
CREATININE: 0.63 mg/dL (ref 0.44–1.00)
Chloride: 104 mmol/L (ref 101–111)
GFR calc non Af Amer: >60 mL/min (ref >60)
GLUCOSE: 81 mg/dL (ref 65–99)
Potassium: 3.5 mmol/L (ref 3.5–5.1)
Sodium: 139 mmol/L (ref 135–145)

## 2016-12-14 LAB — EXPECTORATED SPUTUM ASSESSMENT W GRAM STAIN, RFLX TO RESP C

## 2016-12-14 LAB — CBC
HEMATOCRIT: 39.6 % (ref 36.0–46.0)
Hemoglobin: 12.3 g/dL (ref 12.0–15.0)
MCH: 26.3 pg (ref 26.0–34.0)
MCHC: 31.1 g/dL (ref 30.0–36.0)
MCV: 84.8 fL (ref 78.0–100.0)
Platelets: 403 10*3/uL — ABNORMAL HIGH (ref 150–400)
RBC: 4.67 MIL/uL (ref 3.87–5.11)
RDW: 15.6 % — AB (ref 11.5–15.5)
WBC: 7.7 10*3/uL (ref 4.0–10.5)

## 2016-12-14 MED ORDER — DOCUSATE SODIUM 100 MG PO CAPS: 100.0000 mg | ORAL_CAPSULE | Freq: Every day | ORAL | Status: DC | PRN

## 2016-12-14 MED ORDER — POLYETHYLENE GLYCOL 3350 17 G PO PACK
17.0000 g | PACK | Freq: Two times a day (BID) | ORAL | Status: AC
  Filled 2016-12-14: qty 1

## 2016-12-14 MED ORDER — SENNOSIDES-DOCUSATE SODIUM 8.6-50 MG PO TABS
2.0000 | ORAL_TABLET | Freq: Two times a day (BID) | ORAL | Status: DC
  Administered 2016-12-19: 2 via ORAL
  Filled 2016-12-14 (×7): qty 2

## 2016-12-14 MED ORDER — DOCUSATE SODIUM 100 MG PO CAPS
100.0000 mg | ORAL_CAPSULE | Freq: Two times a day (BID) | ORAL | Status: DC
  Filled 2016-12-14: qty 1

## 2016-12-14 NOTE — Progress Notes (Signed)
LCSW attempted to complete assessment and consult for discharge planning/psycho-social needs.  Patient seen in room, all lights off and sleeping.  Attempted to awake patient, but was unsuccessful.  Will follow up this afternoon to address any needs and potential plans/questions as they pertain to discharge and how LCSW can assist.  Will follow up.  Lane Hacker, MSW Clinical Social Work: [employer] Coverage for :  [phone]

## 2016-12-14 NOTE — NC FL2 (Signed)
[place] LEVEL OF CARE SCREENING TOOL     IDENTIFICATION  Patient Name: Amanda Macias Birthdate: Sep 06, 1945 Sex: female Admission Date (Current Location): 12/10/2016  [facility] and [place] Number:  Herbalist and Address:  The . [facility], [place] [address] [place], [place] [address]      Provider Number: [id number]  Attending Physician Name and Address:  Amanda Patricia, MD  Relative Name and Phone Number:  [phone]    Current Level of Care: Hospital Recommended Level of Care: [place] Prior Approval Number:    Date Approved/Denied:   PASRR Number:    Discharge Plan: SNF    Current Diagnoses: Patient Active Problem List   Diagnosis Date Noted  . Cough 12/11/2016  . Lung mass 12/11/2016  . Hypokalemia 12/11/2016  . Sepsis ([place]) 12/11/2016  . UTI (urinary tract infection) 12/11/2016  . Mass of left lung   . Pleural effusion     Orientation RESPIRATION BLADDER Height & Weight     Self, Time, Situation, Place  Normal Continent Weight: 111.9 kg (246 lb 12.8 oz) Height:  5\' 5"  (165.1 cm)  BEHAVIORAL SYMPTOMS/MOOD NEUROLOGICAL BOWEL NUTRITION STATUS      Continent Diet (Please see DC Summary)  AMBULATORY STATUS COMMUNICATION OF NEEDS Skin   Supervision Verbally Normal                       Personal Care Assistance Level of Assistance  Bathing, Feeding, Dressing Bathing Assistance: Independent Feeding assistance: Independent Dressing Assistance: Independent     Functional Limitations Info             SPECIAL CARE FACTORS FREQUENCY  PT (By licensed PT)     PT Frequency: 5x/week              Contractures      Additional Factors Info  Code Status, Allergies Code Status Info: Full Allergies Info: NKA           Current Medications (12/14/2016):  This is the current hospital active medication list Current Facility-Administered Medications  Medication Dose Route Frequency  Provider Last Rate Last Dose  . 0.9 %  sodium chloride infusion   Intravenous Continuous Ivor Costa, MD 75 mL/hr at 12/13/16 1609    . acetaminophen (TYLENOL) tablet 650 mg  650 mg Oral Q6H PRN Ivor Costa, MD       Or  . acetaminophen (TYLENOL) suppository 650 mg  650 mg Rectal Q6H PRN Ivor Costa, MD      . albuterol (PROVENTIL) (2.5 MG/3ML) 0.083% nebulizer solution 2.5 mg  2.5 mg Nebulization Q4H PRN Ivor Costa, MD      . ceFEPIme (MAXIPIME) 1 g in dextrose 5 % 50 mL IVPB  1 g Intravenous Q8H Arrien, Jimmy Picket, MD   Stopped at 12/14/16 [phone]  . dextromethorphan-guaiFENesin (MUCINEX DM) 30-600 MG per 12 hr tablet 1 tablet  1 tablet Oral BID Ivor Costa, MD   1 tablet at 12/13/16 2253  . enoxaparin (LOVENOX) injection 55 mg  0.5 mg/kg Subcutaneous Q24H Hongalgi, Lenis Dickinson, MD   55 mg at 12/13/16 1406  . lidocaine (XYLOCAINE) 1 % (with pres) injection    PRN Saverio Danker, PA-C   10 mL at 12/12/16 1546  . ondansetron (ZOFRAN) tablet 4 mg  4 mg Oral Q6H PRN Ivor Costa, MD       Or  . ondansetron [facility]) injection 4 mg  4 mg Intravenous Q6H  PRN Ivor Costa, MD      . sodium chloride flush (NS) 0.9 % injection 3 mL  3 mL Intravenous Q12H Ivor Costa, MD   3 mL at 12/13/16 2253  . zolpidem (AMBIEN) tablet 5 mg  5 mg Oral QHS PRN Ivor Costa, MD         Discharge Medications: Please see discharge summary for a list of discharge medications.  Relevant Imaging Results:  Relevant Lab Results:   Additional Information SSN: [name], [place]

## 2016-12-14 NOTE — Progress Notes (Signed)
PULMONARY / CRITICAL CARE MEDICINE   Name: Amanda Macias MRN: [id number] DOB: 10/10/1945    ADMISSION DATE:  12/10/2016 CONSULTATION DATE:  12/12/16  REFERRING MD:  Tera Partridge, MD  CHIEF COMPLAINT:  Lung mass and pleural effusion  HISTORY OF PRESENT ILLNESS:   71 year old ex-smoker presents with 1 month of cough, white mucus, shortness of breath, weakness. She does not report any fevers, chills, hemoptysis. Review of systems is positive for weight loss and appetite. CT scan shows a large left lung mass with invasion of the blood vessels, mediastinal lymph nodes and a large left pleural effusion. PCCM consulted for further evaluation  PAST MEDICAL HISTORY :  She  has a past medical history of Hypokalemia (11/2016); Mass of left lung (11/2016); and UTI (urinary tract infection) (11/2016).  PAST SURGICAL HISTORY: She  has a past surgical history that includes Cholecystectomy and IR THORACENTESIS ASP PLEURAL SPACE W/IMG GUIDE (12/12/2016).  No Known Allergies  No current facility-administered medications on file prior to encounter.    No current outpatient prescriptions on file prior to encounter.    FAMILY HISTORY:  Her indicated that the status of her father is unknown.    SOCIAL HISTORY: She  reports that she has quit smoking. She has never used smokeless tobacco. She reports that she does not drink alcohol or use drugs.  REVIEW OF SYSTEMS:   As above Complains of cough, white sputum, dyspnea No fevers, chills, hemoptysis No chest pain, palpitations  SUBJECTIVE:  No issues overnight Resp status continues to be stable.  VITAL SIGNS: BP (!) 141/87 (BP Location: Right Arm)   Pulse 91   Temp 98.5 F (36.9 C)   Resp 19   Ht 5\' 5"  (1.651 m)   Wt 246 lb 12.8 oz (111.9 kg)   SpO2 92%   BMI 41.07 kg/m   HEMODYNAMICS:    VENTILATOR SETTINGS:    INTAKE / OUTPUT: I/O last 3 completed shifts: In: 3165 [P.O.:240; I.V.:2475; IV Piggyback:450] Out: 200  [Urine:200]  PHYSICAL EXAMINATION: Blood pressure (!) 141/87, pulse 91, temperature 98.5 F (36.9 C), resp. rate 19, height 5\' 5"  (1.651 m), weight 246 lb 12.8 oz (111.9 kg), SpO2 92 %. Gen:      No acute distress HEENT:  EOMI, sclera anicteric Neck:     No masses; no thyromegaly Lungs:    Clear to auscultation bilaterally; normal respiratory effort CV:         Regular rate and rhythm; no murmurs Abd:      + bowel sounds; soft, non-tender; no palpable masses, no distension Ext:    No edema; adequate peripheral perfusion Skin:      Warm and dry; no rash Neuro: alert and oriented x 3 Psych: normal mood and affect  LABS:  BMET  Recent Labs Lab 12/12/16 0558 12/13/16 0526 12/14/16 0547  NA 137 138 139  K 2.9* 3.4* 3.5  CL 100* 102 104  CO2 26 26 21*  BUN 7 6 5*  CREATININE 0.70 0.67 0.63  GLUCOSE 87 90 81    Electrolytes  Recent Labs Lab 12/11/16 0307 12/12/16 0558 12/13/16 0526 12/14/16 0547  CALCIUM 8.5* 9.0 9.0 9.0  MG 1.3* 1.6* 2.1  --     CBC  Recent Labs Lab 12/12/16 0558 12/13/16 0526 12/14/16 0547  WBC 10.2 8.0 7.7  HGB 11.7* 12.0 12.3  HCT 37.5 38.1 39.6  PLT 412* 387 403*    Coag's  Recent Labs Lab 12/11/16 0307  APTT 42*  INR  1.33    Sepsis Markers  Recent Labs Lab 12/10/16 2230 12/11/16 0155 12/11/16 0307  LATICACIDVEN 3.00* 2.38* 1.7  PROCALCITON  --   --  <0.10    ABG No results for input(s): PHART, PCO2ART, PO2ART in the last 168 hours.  Liver Enzymes  Recent Labs Lab 12/10/16 2214 12/11/16 0307  AST 37 29  ALT 14 12*  ALKPHOS 66 54  BILITOT 1.8* 1.5*  ALBUMIN 3.3* 2.8*    Cardiac Enzymes  Recent Labs Lab 12/10/16 2214  TROPONINI <0.03    Glucose  Recent Labs Lab 12/11/16 0818 12/12/16 0808  GLUCAP 113* 92    Imaging CT chest 12/11/16 > underfilling of the right lower lobe subsegmental arteries. Large left effusion, left upper lobe infiltrate with mass with mediastinal, hilar invasion, vascular  encasement. CXR 12/12/16 > Mild improvement in left effusion.  I reviewed all images personally.  STUDIES:   CULTURES: Ucx 6/16 > E coli Bcx 6/16 > E colace, bacillus  ANTIBIOTICS: Ceftriaxone 6/17 Cefepime 6/17 >> Azithro 6/18>  SIGNIFICANT EVENTS:   LINES/TUBES:  DISCUSSION: 71 year old ex-smoker with a large left lung mass with mediastinal, hilar, vascular involvement, large left pleural effusion, UTI The CT images is suspicious for malignancy, possible small cell carcinoma. Other possibilities include lt lung pneumonia with para pneumonic effusion but she does not appear infected. I'm not very concerned that there is a PE on the right lower lobe. Patient does not have any hemodynamic or respiratory compromise at present.  Noted to have e coli in urine and e cloacae and bacillus in blood.  Pleural fluid studies reviewed. It shows exudative effusion. Cytology is negative  - Plan for EBUS bronchoscopy tomorrow 6/21 at 10 am as suspicion for malignancy is high. Keep NPO after midnight - Continue antibiotics.  All risks of procedure including hemorrhage, bleeding, pneumothorax, cardiac arrest were discussed with the patient and she agreed to proceed with the bronchoscope.  Marshell Garfinkel MD [place] [facility] Pager [phone] If no answer or after 3pm call: [phone] 12/14/2016, 7:27 AM

## 2016-12-14 NOTE — Progress Notes (Signed)
Patient states she had bm yesterday evening and doesn't want to take stool softeners yet.

## 2016-12-14 NOTE — Progress Notes (Signed)
PROGRESS NOTE   Amanda Macias  DQQ:[id number]    DOB: 1946-01-15    DOA: 12/10/2016  PCP: System, Pcp Not In   I have briefly reviewed patients previous medical records in [facility].  Brief Narrative:  71 year old female, former smoker, otherwise no known medical history, presented with nonproductive cough, dyspnea, generalized weakness and weight loss. CT chest showed a large left lung mass with invasion of the blood vessels, mediastinal lymph nodes and a large pleural effusion. PCCM consulted and recommended IR for thoracentesis (done) and if cytology negative then consider FOB. Status post thoracentesis 6/18 by IR.   Assessment & Plan:   Principal Problem:   Mass of left lung Active Problems:   Cough   Hypokalemia   Sepsis (HCC)   UTI (urinary tract infection)   Pleural effusion  Left lung mass and large left pleural effusion:  - Highly suspicious for bronchogenic carcinoma. Pulmonology consultation appreciated. - status post thoracentesis 6/18 with 2 L drained, looks exudative. - Cytology of pleural effusion date with no evidence of malignancy, but lesion on imaging highly suspicious for malignancy, pulmonary input appreciated, plan for endoscopy with biopsy tomorrow . - Continue with empiric antibiotic coverage, so far Gram stain and culture are negative  Escherichia coli UTI:  - Continue IV cefepime , urine culture growing Escherichia coli  Bacteremia:  -1 blood culture growing gram variable rods bacillus which is very likely contaminant,  - another blood culture growing Enterobacter colace, unclear source of infection, most likely pulmonary in the setting of abnormal CT findings, follow on lower diffusion culture, so far stains are negative, as well cultures with no growth, continue with cefepime - Continue with IV cefepime, peak blood cultures with no growth to date.  Hypokalemia/hypomagnesemia:  - Repleted    Elevated d-dimer:  -Pulmonary input appreciated,  low index of suspicion for PE. IV heparin stopped. - lower extremity venous Dopplers negative for DVT   Health screening: HIV: Nonreactive.   DVT prophylaxis: Lovenox  Code Status: Full  Family Communication: None at bedside  Disposition: DC home when medically stable  Consultants:  PCCM IR   Procedures:  Diagnostic and therapeutic ultrasound-guided left thoracentesis yielding 2 L on 12/12/16  Antimicrobials:  IV cefepime Discontinued IV Rocephin and azithromycin    Subjective: Reports she is feeling better, dyspnea has improved, still reports cough, productive with clear sputum, denies any chest pain,   ROS: Denies dizziness or lightheadedness.  Objective:  Vitals:   12/13/16 0558 12/13/16 1500 12/13/16 2147 12/14/16 0617  BP: 131/79 (!) 103/49 127/67 (!) 141/87  Pulse: 85 91 92 91  Resp: 18 17 19 19   Temp: 98.5 F (36.9 C) 98.5 F (36.9 C) 98.4 F (36.9 C) 98.5 F (36.9 C)  TempSrc: Oral Oral    SpO2: 92% 90% 92% 92%  Weight:      Height:      Temperature 98.13F, RR: 18/m  Examination:  Awake alert oriented 3, laying in bed in no apparent distress  Diminished air entry at the bases, no wheezing, no use of accessory muscles  S1 ,S2 heard, no rubs or gallops, regular rate and rhythm  Abdomen soft, nontender, nondistended, bowel sounds present No focal neurological deficits   Judgement and insight appear normal. Mood & affect appropriate.     Data Reviewed: I have personally reviewed following labs and imaging studies  CBC:  Recent Labs Lab 12/10/16 2214 12/11/16 0307 12/12/16 0558 12/13/16 0526 12/14/16 0547  WBC 12.4* 10.6* 10.2  8.0 7.7  NEUTROABS 10.4*  --   --   --   --   HGB 13.0 12.0 11.7* 12.0 12.3  HCT 41.3 37.6 37.5 38.1 39.6  MCV 84.8 83.2 83.7 83.7 84.8  PLT 451* 385 412* 387 093*   Basic Metabolic Panel:  Recent Labs Lab 12/10/16 2214 12/11/16 0307 12/12/16 0558 12/13/16 0526 12/14/16 0547  NA 135 132* 137 138 139  K 2.8*  2.8* 2.9* 3.4* 3.5  CL 95* 94* 100* 102 104  CO2 23 22 26 26  21*  GLUCOSE 102* 308* 87 90 81  BUN 10 8 7 6  5*  CREATININE 1.06* 0.88 0.70 0.67 0.63  CALCIUM 9.4 8.5* 9.0 9.0 9.0  MG  --  1.3* 1.6* 2.1  --    Liver Function Tests:  Recent Labs Lab 12/10/16 2214 12/11/16 0307  AST 37 29  ALT 14 12*  ALKPHOS 66 54  BILITOT 1.8* 1.5*  PROT 7.7 6.6  ALBUMIN 3.3* 2.8*   Coagulation Profile:  Recent Labs Lab 12/11/16 0307  INR 1.33   Cardiac Enzymes:  Recent Labs Lab 12/10/16 2214  TROPONINI <0.03   HbA1C: No results for input(s): HGBA1C in the last 72 hours. CBG:  Recent Labs Lab 12/11/16 0818 12/12/16 0808  GLUCAP 113* 92    Recent Results (from the past 240 hour(s))  Urine Culture     Status: Abnormal   Collection Time: 12/10/16 10:08 PM  Result Value Ref Range Status   Specimen Description URINE, RANDOM  Final   Special Requests NONE  Final   Culture >=100,000 COLONIES/mL ESCHERICHIA COLI (A)  Final   Report Status 12/13/2016 FINAL  Final   Organism ID, Bacteria ESCHERICHIA COLI (A)  Final      Susceptibility   Escherichia coli - MIC*    AMPICILLIN >=32 RESISTANT Resistant     CEFAZOLIN <=4 SENSITIVE Sensitive     CEFTRIAXONE <=1 SENSITIVE Sensitive     CIPROFLOXACIN <=0.25 SENSITIVE Sensitive     GENTAMICIN <=1 SENSITIVE Sensitive     IMIPENEM <=0.25 SENSITIVE Sensitive     NITROFURANTOIN <=16 SENSITIVE Sensitive     TRIMETH/SULFA <=20 SENSITIVE Sensitive     AMPICILLIN/SULBACTAM 16 INTERMEDIATE Intermediate     PIP/TAZO <=4 SENSITIVE Sensitive     Extended ESBL NEGATIVE Sensitive     * >=100,000 COLONIES/mL ESCHERICHIA COLI  Blood culture (routine x 2)     Status: Abnormal   Collection Time: 12/10/16 10:15 PM  Result Value Ref Range Status   Specimen Description BLOOD RIGHT ARM  Final   Special Requests   Final    BOTTLES DRAWN AEROBIC AND ANAEROBIC Blood Culture adequate volume   Culture  Setup Time   Final    GRAM NEGATIVE RODS IN BOTH  AEROBIC AND ANAEROBIC BOTTLES CRITICAL RESULT CALLED TO, READ BACK BY AND VERIFIED WITH: Ellin Mayhew [place] [phone] MLM    Culture ENTEROBACTER CLOACAE (A)  Final   Report Status 12/13/2016 FINAL  Final   Organism ID, Bacteria ENTEROBACTER CLOACAE  Final      Susceptibility   Enterobacter cloacae - MIC*    CEFAZOLIN >=64 RESISTANT Resistant     CEFEPIME <=1 SENSITIVE Sensitive     CEFTAZIDIME <=1 SENSITIVE Sensitive     CEFTRIAXONE <=1 SENSITIVE Sensitive     CIPROFLOXACIN <=0.25 SENSITIVE Sensitive     GENTAMICIN <=1 SENSITIVE Sensitive     IMIPENEM <=0.25 SENSITIVE Sensitive     TRIMETH/SULFA <=20 SENSITIVE Sensitive  PIP/TAZO <=4 SENSITIVE Sensitive     * ENTEROBACTER CLOACAE  Blood Culture ID Panel (Reflexed)     Status: Abnormal   Collection Time: 12/10/16 10:15 PM  Result Value Ref Range Status   Enterococcus species NOT DETECTED NOT DETECTED Final   Listeria monocytogenes NOT DETECTED NOT DETECTED Final   Staphylococcus species NOT DETECTED NOT DETECTED Final   Staphylococcus aureus NOT DETECTED NOT DETECTED Final   Streptococcus species NOT DETECTED NOT DETECTED Final   Streptococcus agalactiae NOT DETECTED NOT DETECTED Final   Streptococcus pneumoniae NOT DETECTED NOT DETECTED Final   Streptococcus pyogenes NOT DETECTED NOT DETECTED Final   Acinetobacter baumannii NOT DETECTED NOT DETECTED Final   Enterobacteriaceae species DETECTED (A) NOT DETECTED Final    Comment: Enterobacteriaceae represent a large family of gram-negative bacteria, not a single organism. CRITICAL RESULT CALLED TO, READ BACK BY AND VERIFIED WITH: PHARMD M [place] [phone] MLM    Enterobacter cloacae complex DETECTED (A) NOT DETECTED Final    Comment: CRITICAL RESULT CALLED TO, READ BACK BY AND VERIFIED WITH: PHARMD M [place] [phone] MLM    Escherichia coli NOT DETECTED NOT DETECTED Final   Klebsiella oxytoca NOT DETECTED NOT DETECTED Final   Klebsiella pneumoniae NOT DETECTED NOT  DETECTED Final   Proteus species NOT DETECTED NOT DETECTED Final   Serratia marcescens NOT DETECTED NOT DETECTED Final   Carbapenem resistance NOT DETECTED NOT DETECTED Final   Haemophilus influenzae NOT DETECTED NOT DETECTED Final   Neisseria meningitidis NOT DETECTED NOT DETECTED Final   Pseudomonas aeruginosa NOT DETECTED NOT DETECTED Final   Candida albicans NOT DETECTED NOT DETECTED Final   Candida glabrata NOT DETECTED NOT DETECTED Final   Candida krusei NOT DETECTED NOT DETECTED Final   Candida parapsilosis NOT DETECTED NOT DETECTED Final   Candida tropicalis NOT DETECTED NOT DETECTED Final  Blood culture (routine x 2)     Status: Abnormal   Collection Time: 12/10/16 10:50 PM  Result Value Ref Range Status   Specimen Description BLOOD RIGHT HAND  Final   Special Requests IN PEDIATRIC BOTTLE Blood Culture adequate volume  Final   Culture  Setup Time (A)  Final    GRAM VARIABLE ROD IN PEDIATRIC BOTTLE CRITICAL RESULT CALLED TO, READ BACK BY AND VERIFIED WITH: Hughie Closs, PHARMD AT 1244 12/11/16 BY  L BENFIELD    Culture (A)  Final    BACILLUS SPECIES Standardized susceptibility testing for this organism is not available.    Report Status 12/13/2016 FINAL  Final  Blood Culture ID Panel (Reflexed)     Status: None   Collection Time: 12/10/16 10:50 PM  Result Value Ref Range Status   Enterococcus species NOT DETECTED NOT DETECTED Final   Listeria monocytogenes NOT DETECTED NOT DETECTED Final   Staphylococcus species NOT DETECTED NOT DETECTED Final   Staphylococcus aureus NOT DETECTED NOT DETECTED Final   Streptococcus species NOT DETECTED NOT DETECTED Final   Streptococcus agalactiae NOT DETECTED NOT DETECTED Final   Streptococcus pneumoniae NOT DETECTED NOT DETECTED Final   Streptococcus pyogenes NOT DETECTED NOT DETECTED Final   Acinetobacter baumannii NOT DETECTED NOT DETECTED Final   Enterobacteriaceae species NOT DETECTED NOT DETECTED Final   Enterobacter cloacae complex  NOT DETECTED NOT DETECTED Final   Escherichia coli NOT DETECTED NOT DETECTED Final   Klebsiella oxytoca NOT DETECTED NOT DETECTED Final   Klebsiella pneumoniae NOT DETECTED NOT DETECTED Final   Proteus species NOT DETECTED NOT DETECTED Final   Serratia marcescens NOT DETECTED  NOT DETECTED Final   Haemophilus influenzae NOT DETECTED NOT DETECTED Final   Neisseria meningitidis NOT DETECTED NOT DETECTED Final   Pseudomonas aeruginosa NOT DETECTED NOT DETECTED Final   Candida albicans NOT DETECTED NOT DETECTED Final   Candida glabrata NOT DETECTED NOT DETECTED Final   Candida krusei NOT DETECTED NOT DETECTED Final   Candida parapsilosis NOT DETECTED NOT DETECTED Final   Candida tropicalis NOT DETECTED NOT DETECTED Final  Culture, expectorated sputum-assessment     Status: None   Collection Time: 12/11/16  6:14 AM  Result Value Ref Range Status   Specimen Description EXPECTORATED SPUTUM  Final   Special Requests NONE  Final   Sputum evaluation   Final    Sputum specimen not acceptable for testing.  Please recollect.   RESULT CALLED TO, READ BACK BY AND VERIFIED WITH: Madelin Rear RN 11:30 12/14/16 ([email or web address])    Report Status 12/14/2016 FINAL  Final  Gram stain     Status: None   Collection Time: 12/12/16  4:16 PM  Result Value Ref Range Status   Specimen Description PLEURAL LEFT  Final   Special Requests NONE  Final   Gram Stain   Final    FEW WBC PRESENT, PREDOMINANTLY MONONUCLEAR NO ORGANISMS SEEN    Report Status 12/13/2016 FINAL  Final  Culture, body fluid-bottle     Status: None (Preliminary result)   Collection Time: 12/12/16  4:16 PM  Result Value Ref Range Status   Specimen Description PLEURAL LEFT  Final   Special Requests BOTTLES DRAWN AEROBIC AND ANAEROBIC  Final   Culture NO GROWTH < 24 HOURS  Final   Report Status PENDING  Incomplete         Radiology Studies: Dg Chest 1 View  Result Date: 12/12/2016 CLINICAL DATA:  Thoracentesis. EXAM: CHEST 1 VIEW COMPARISON:   Ultrasound same day. CT chest 12/11/2016. Chest x-ray 12/10/2016. FINDINGS: Interim decrease in left pleural effusion. No pneumothorax post thoracentesis . Prominent persistent opacification of the left hemithorax remains. IMPRESSION: Interim decrease in left-sided pleural effusion. No pneumothorax following left-sided thoracentesis. Electronically Signed   By: Marcello Moores  Register   On: 12/12/2016 16:33   Ir Thoracentesis Asp Pleural Space W/img Guide  Result Date: 12/12/2016 INDICATION: New left-sided pleural effusion with possible left lung mass. Request is made for diagnostic and therapeutic thoracentesis. EXAM: ULTRASOUND GUIDED DIAGNOSTIC AND THERAPEUTIC THORACENTESIS MEDICATIONS: 1% lidocaine COMPLICATIONS: None immediate. PROCEDURE: An ultrasound guided thoracentesis was thoroughly discussed with the patient and questions answered. The benefits, risks, alternatives and complications were also discussed. The patient understands and wishes to proceed with the procedure. Written consent was obtained. Ultrasound was performed to localize and mark an adequate pocket of fluid in the left chest. The area was then prepped and draped in the normal sterile fashion. 1% Lidocaine was used for local anesthesia. Under ultrasound guidance a Safe-T-Centesis catheter was introduced. Thoracentesis was performed. The catheter was removed and a dressing applied. FINDINGS: A total of approximately 2 L of serosanguineous fluid was removed. Samples were sent to the laboratory as requested by the clinical team. IMPRESSION: Successful ultrasound guided left thoracentesis yielding 2 L of pleural fluid. Read by: Saverio Danker, PA-C Electronically Signed   By: Marybelle Killings M.D.   On: 12/12/2016 16:46        Scheduled Meds: . dextromethorphan-guaiFENesin  1 tablet Oral BID  . enoxaparin (LOVENOX) injection  0.5 mg/kg Subcutaneous Q24H  . polyethylene glycol  17 g Oral BID  . senna-docusate  2 tablet  Oral BID  . sodium  chloride flush  3 mL Intravenous Q12H   Continuous Infusions: . sodium chloride 75 mL/hr at 12/13/16 1609  . ceFEPime (MAXIPIME) IV Stopped (12/14/16 4276)     LOS: 3 days     Phillips Climes, MD [facility] Pager [phone]  If 7PM-7AM, please contact night-coverage [email or web address] Password [facility] 12/14/2016, 12:28 PM

## 2016-12-14 NOTE — Evaluation (Signed)
Occupational Therapy Evaluation Patient Details Name: Amanda Macias MRN: [id number] DOB: [birth year] Today's Date: 12/14/2016    History of Present Illness Pt is a 71 y/o female admitted secondary to L lung mass, UTI,  and hypokalemia. Pt had persistent cough, nasal congestions, fatigue, nausea, diarrhea, and fever. PMH includes former smoker.    Clinical Impression   Pt was independent prior to admission in self care and IADL, but had been struggling over the last month due to generalized weakness and fatigue. Pt presents with impaired balance and decreased activity tolerance interfering with ability to perform ADL and mobility. Will follow acutely. Recommending SNF as pt lives alone.     Follow Up Recommendations  SNF    Equipment Recommendations  3 in 1 bedside commode    Recommendations for Other Services       Precautions / Restrictions Precautions Precautions: Fall Precaution Comments: Watch O2 sats  Restrictions Weight Bearing Restrictions: No      Mobility Bed Mobility               General bed mobility comments: pt sitting at EOB upon arrival  Transfers Overall transfer level: Needs assistance Equipment used: None Transfers: Sit to/from Stand Sit to Stand: Supervision         General transfer comment: from bed and 3 in 1    Balance Overall balance assessment: Needs assistance   Sitting balance-Leahy Scale: Good Sitting balance - Comments: no LOB with attempt to reach feet     Standing balance-Leahy Scale: Fair Standing balance comment: able to release walker in static standing                           ADL either performed or assessed with clinical judgement   ADL Overall ADL's : Needs assistance/impaired Eating/Feeding: Independent;Sitting   Grooming: Supervision/safety;Standing;Wash/dry hands   Upper Body Bathing: Set up;Sitting   Lower Body Bathing: Min guard;Sit to/from stand   Upper Body Dressing : Set up;Sitting    Lower Body Dressing: Minimal assistance;Sit to/from stand Lower Body Dressing Details (indicate cue type and reason): pt typically wears slip on shoes, no socks in the summer Toilet Transfer: Min guard;Ambulation (pushed IV pole)   Toileting- Clothing Manipulation and Hygiene: Supervision/safety;Sit to/from stand       Functional mobility during ADLs: Min guard (pushing IV pole)       Vision Patient Visual Report: No change from baseline       Perception     Praxis      Pertinent Vitals/Pain Pain Assessment: No/denies pain     Hand Dominance Right   Extremity/Trunk Assessment Upper Extremity Assessment Upper Extremity Assessment: Overall WFL for tasks assessed   Lower Extremity Assessment Lower Extremity Assessment: Defer to PT evaluation   Cervical / Trunk Assessment Cervical / Trunk Assessment: Kyphotic   Communication Communication Communication: No difficulties (low volume/hoarse)   Cognition Arousal/Alertness: Awake/alert Behavior During Therapy: WFL for tasks assessed/performed Overall Cognitive Status: Within Functional Limits for tasks assessed                                     General Comments       Exercises     Shoulder Instructions      Home Living Family/patient expects to be discharged to:: Private residence Living Arrangements: Alone Available Help at Discharge: Family;Available PRN/intermittently Type of Home: [place]  Access: Stairs to enter [employer] of Steps: 2 Entrance Stairs-Rails: None Home Layout: One level     Bathroom Shower/Tub: Tub/shower unit;Walk-in shower   Bathroom Toilet: Standard     Home Equipment: Cane - single point          Prior Functioning/Environment Level of Independence: Independent with assistive device(s)        Comments: Pt reports using a cane at baseline. Reports she had been having increased difficulty with functional tasks at home, secondary to increased  weakness and fatigue x 4 weeks. Had sponge bathed as a result.         OT Problem List: Decreased strength;Decreased activity tolerance;Impaired balance (sitting and/or standing);Obesity      OT Treatment/Interventions: Self-care/ADL training;Energy conservation;Patient/family education;Therapeutic activities;DME and/or AE instruction    OT Goals(Current goals can be found in the care plan section) Acute Rehab OT Goals Patient Stated Goal: to go to rehab to get stronger OT Goal Formulation: With patient Time For Goal Achievement: 12/28/16 Potential to Achieve Goals: Good ADL Goals Pt Will Perform Grooming: with modified independence;standing Pt Will Perform Lower Body Dressing: with modified independence;sit to/from stand Pt Will Transfer to Toilet: with modified independence;ambulating;bedside commode (over toilet as needed) Pt Will Perform Toileting - Clothing Manipulation and hygiene: with modified independence;sit to/from stand Additional ADL Goal #1: Pt will state at least 3 energy conservation strategies and instructed.  OT Frequency: Min 2X/week   Barriers to D/C: Decreased caregiver support          Co-evaluation              AM-PAC PT "6 Clicks" Daily Activity     Outcome Measure Help from another person eating meals?: None Help from another person taking care of personal grooming?: A Little Help from another person toileting, which includes using toliet, bedpan, or urinal?: A Little Help from another person bathing (including washing, rinsing, drying)?: A Little Help from another person to put on and taking off regular upper body clothing?: None Help from another person to put on and taking off regular lower body clothing?: A Little 6 Click Score: 20   End of Session    Activity Tolerance: Patient limited by fatigue Patient left: in bed;with call bell/phone within reach;with family/visitor present  OT Visit Diagnosis: Unsteadiness on feet (R26.81);Muscle  weakness (generalized) (M62.81)                Time: 0388-8280 OT Time Calculation (min): 15 min Charges:  OT General Charges $OT Visit: 1 Procedure OT Evaluation $OT Eval Moderate Complexity: 1 Procedure G-Codes:     Malka So 12/14/2016, 3:38 PM  [phone]

## 2016-12-15 ENCOUNTER — Encounter (HOSPITAL_COMMUNITY)
Admission: EM | Disposition: A | Discharge status: Skilled Nursing Facility | Payer: Self-pay | Source: Home / Self Care | Attending: Internal Medicine

## 2016-12-15 ENCOUNTER — Inpatient Hospital Stay (HOSPITAL_COMMUNITY): Payer: Medicare Other

## 2016-12-15 ENCOUNTER — Encounter (HOSPITAL_COMMUNITY): Payer: Self-pay | Admitting: Anesthesiology

## 2016-12-15 ENCOUNTER — Inpatient Hospital Stay (HOSPITAL_COMMUNITY): Payer: Medicare Other | Admitting: Anesthesiology

## 2016-12-15 ENCOUNTER — Ambulatory Visit
Admit: 2016-12-15 | Discharge: 2016-12-15 | Disposition: A | Discharge status: Home / Self Care | Payer: Medicare Other | Attending: Radiation Oncology | Admitting: Radiation Oncology

## 2016-12-15 DIAGNOSIS — I871 Compression of vein: Secondary | ICD-10-CM

## 2016-12-15 DIAGNOSIS — J90 Pleural effusion, not elsewhere classified: Secondary | ICD-10-CM

## 2016-12-15 DIAGNOSIS — R0600 Dyspnea, unspecified: Secondary | ICD-10-CM

## 2016-12-15 DIAGNOSIS — R05 Cough: Secondary | ICD-10-CM

## 2016-12-15 HISTORY — PX: VIDEO BRONCHOSCOPY WITH ENDOBRONCHIAL ULTRASOUND: SHX6177

## 2016-12-15 LAB — SURGICAL PCR SCREEN
MRSA, PCR: NEGATIVE
Staphylococcus aureus: NEGATIVE

## 2016-12-15 LAB — EXPECTORATED SPUTUM ASSESSMENT W REFEX TO RESP CULTURE

## 2016-12-15 LAB — EXPECTORATED SPUTUM ASSESSMENT W GRAM STAIN, RFLX TO RESP C

## 2016-12-15 SURGERY — BRONCHOSCOPY, WITH EBUS
Anesthesia: General

## 2016-12-15 MED ORDER — PROPOFOL 10 MG/ML IV BOLUS
INTRAVENOUS | Status: AC
  Filled 2016-12-15: qty 40

## 2016-12-15 MED ORDER — MIDAZOLAM HCL 2 MG/2ML IJ SOLN
INTRAMUSCULAR | Status: AC
  Filled 2016-12-15: qty 2

## 2016-12-15 MED ORDER — NYSTATIN 100000 UNIT/GM EX POWD
Freq: Three times a day (TID) | CUTANEOUS | Status: DC
  Administered 2016-12-15 – 2016-12-18 (×8): via TOPICAL
  Filled 2016-12-15 (×2): qty 15

## 2016-12-15 MED ORDER — DEXTROSE 5 % IV SOLN
2.0000 g | INTRAVENOUS | Status: DC
  Administered 2016-12-15 – 2016-12-19 (×5): 2 g via INTRAVENOUS
  Filled 2016-12-15 (×5): qty 2

## 2016-12-15 MED ORDER — ROCURONIUM BROMIDE 100 MG/10ML IV SOLN
INTRAVENOUS | Status: DC | PRN
  Administered 2016-12-15: 10 mg via INTRAVENOUS
  Administered 2016-12-15: 40 mg via INTRAVENOUS

## 2016-12-15 MED ORDER — EPINEPHRINE PF 1 MG/ML IJ SOLN
INTRAMUSCULAR | Status: AC
  Filled 2016-12-15: qty 1

## 2016-12-15 MED ORDER — LIDOCAINE HCL 4 % EX SOLN
CUTANEOUS | Status: DC | PRN
  Administered 2016-12-15: 4 mL via TOPICAL

## 2016-12-15 MED ORDER — SUGAMMADEX SODIUM 200 MG/2ML IV SOLN
INTRAVENOUS | Status: DC | PRN
  Administered 2016-12-15: 225 mg via INTRAVENOUS

## 2016-12-15 MED ORDER — FENTANYL CITRATE (PF) 100 MCG/2ML IJ SOLN: 25.0000 ug | INTRAMUSCULAR | Status: DC | PRN

## 2016-12-15 MED ORDER — PROPOFOL 10 MG/ML IV BOLUS
INTRAVENOUS | Status: DC | PRN
Start: 2016-12-15 — End: 2016-12-15
  Administered 2016-12-15: 50 mg via INTRAVENOUS
  Administered 2016-12-15: 100 mg via INTRAVENOUS

## 2016-12-15 MED ORDER — FENTANYL CITRATE (PF) 250 MCG/5ML IJ SOLN
INTRAMUSCULAR | Status: AC
  Filled 2016-12-15: qty 5

## 2016-12-15 MED ORDER — ONDANSETRON HCL 4 MG/2ML IJ SOLN
INTRAMUSCULAR | Status: DC | PRN
  Administered 2016-12-15: 4 mg via INTRAVENOUS

## 2016-12-15 MED ORDER — LACTATED RINGERS IV SOLN
INTRAVENOUS | Status: DC
  Administered 2016-12-15 (×2): via INTRAVENOUS

## 2016-12-15 MED ORDER — LIDOCAINE HCL (CARDIAC) 20 MG/ML IV SOLN
INTRAVENOUS | Status: DC | PRN
  Administered 2016-12-15: 60 mg via INTRAVENOUS
  Administered 2016-12-15: 40 mg via INTRAVENOUS

## 2016-12-15 MED ORDER — 0.9 % SODIUM CHLORIDE (POUR BTL) OPTIME
TOPICAL | Status: DC | PRN
  Administered 2016-12-15: 1000 mL

## 2016-12-15 MED ORDER — FENTANYL CITRATE (PF) 100 MCG/2ML IJ SOLN
INTRAMUSCULAR | Status: DC | PRN
  Administered 2016-12-15: 25 ug via INTRAVENOUS
  Administered 2016-12-15: 50 ug via INTRAVENOUS

## 2016-12-15 SURGICAL SUPPLY — 28 items
BRUSH CYTOL CELLEBRITY 1.5X140 (MISCELLANEOUS) ×3 IMPLANT
CANISTER SUCT 3000ML PPV (MISCELLANEOUS) ×3 IMPLANT
CONT SPEC 4OZ CLIKSEAL STRL BL (MISCELLANEOUS) ×3 IMPLANT
COVER BACK TABLE 60X90IN (DRAPES) ×3 IMPLANT
COVER DOME SNAP 22 D (MISCELLANEOUS) ×3 IMPLANT
FILTER STRAW FLUID ASPIR (MISCELLANEOUS) IMPLANT
FORCEPS BIOP RJ4 1.8 (CUTTING FORCEPS) ×3 IMPLANT
GAUZE SPONGE 4X4 12PLY STRL (GAUZE/BANDAGES/DRESSINGS) ×3 IMPLANT
GLOVE BIO SURGEON STRL SZ7.5 (GLOVE) ×3 IMPLANT
GOWN STRL REUS W/ TWL LRG LVL3 (GOWN DISPOSABLE) ×2 IMPLANT
GOWN STRL REUS W/TWL LRG LVL3 (GOWN DISPOSABLE) ×4
KIT CLEAN ENDO COMPLIANCE (KITS) ×6 IMPLANT
KIT ROOM TURNOVER OR (KITS) ×3 IMPLANT
MARKER SKIN DUAL TIP RULER LAB (MISCELLANEOUS) ×3 IMPLANT
NEEDLE ECHOTIP HI DEF 22GA (NEEDLE) ×3 IMPLANT
NEEDLE SONO TIP II EBUS (NEEDLE) ×3 IMPLANT
NS IRRIG 1000ML POUR BTL (IV SOLUTION) ×3 IMPLANT
OIL SILICONE PENTAX (PARTS (SERVICE/REPAIRS)) ×3 IMPLANT
PAD ARMBOARD 7.5X6 YLW CONV (MISCELLANEOUS) ×6 IMPLANT
SYR 20CC LL (SYRINGE) IMPLANT
SYR 20ML ECCENTRIC (SYRINGE) ×9 IMPLANT
SYR 3ML LL SCALE MARK (SYRINGE) IMPLANT
SYR 5ML LUER SLIP (SYRINGE) ×3 IMPLANT
TOWEL OR 17X24 6PK STRL BLUE (TOWEL DISPOSABLE) ×3 IMPLANT
TRAP SPECIMEN MUCOUS 40CC (MISCELLANEOUS) IMPLANT
TUBE CONNECTING 20'X1/4 (TUBING) ×1
TUBE CONNECTING 20X1/4 (TUBING) ×2 IMPLANT
WATER STERILE IRR 1000ML POUR (IV SOLUTION) ×3 IMPLANT

## 2016-12-15 NOTE — Progress Notes (Signed)
The patient's films and case have been reviewed and formal consultation will follow once the patient has been taken in transfer from [facility].     Carola Rhine, PAC

## 2016-12-15 NOTE — Progress Notes (Signed)
PULMONARY / CRITICAL CARE MEDICINE   Name: SHALAH ESTELLE MRN: [id number] DOB: 1945/08/01    ADMISSION DATE:  12/10/2016 CONSULTATION DATE:  12/12/16  REFERRING MD:  Tera Partridge, MD  CHIEF COMPLAINT:  Lung mass and pleural effusion  HISTORY OF PRESENT ILLNESS:   71 year old ex-smoker presents with 1 month of cough, white mucus, shortness of breath, weakness. She does not report any fevers, chills, hemoptysis. Review of systems is positive for weight loss and appetite. CT scan shows a large left lung mass with invasion of the blood vessels, mediastinal lymph nodes and a large left pleural effusion. PCCM consulted for further evaluation.   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  PAST SURGICAL HISTORY: She  has a past surgical history that includes Cholecystectomy and IR THORACENTESIS ASP PLEURAL SPACE W/IMG GUIDE (12/12/2016).  Allergies  Allergen Reactions  . No Known Allergies     No current facility-administered medications on file prior to encounter.    No current outpatient prescriptions on file prior to encounter.    FAMILY HISTORY:  Her indicated that the status of her father is unknown.    SOCIAL HISTORY: She  reports that she has quit smoking. She has never used smokeless tobacco. She reports that she does not drink alcohol or use drugs.  REVIEW OF SYSTEMS:   As above Complains of cough, white sputum, dyspnea No fevers, chills, hemoptysis No chest pain, palpitations  SUBJECTIVE:  No issues overnight Resp status continues to be stable.  VITAL SIGNS: BP 139/73 (BP Location: Left Arm)   Pulse 80   Temp 98.5 F (36.9 C)   Resp 18   Ht 5\' 5"  (1.651 m)   Wt 246 lb 12.8 oz (111.9 kg)   SpO2 98%   BMI 41.07 kg/m   HEMODYNAMICS:    VENTILATOR SETTINGS:    INTAKE / OUTPUT: I/O last 3 completed shifts: In: 863.8 [I.V.:813.8; IV  Piggyback:50] Out: 200 [Urine:200]  PHYSICAL EXAMINATION: Blood pressure 139/73, pulse 80, temperature 98.5 F (36.9 C), resp. rate 18, height 5\' 5"  (1.651 m), weight 246 lb 12.8 oz (111.9 kg), SpO2 98 %. Gen:      No acute distress HEENT:  EOMI, sclera anicteric Neck:     No masses; no thyromegaly Lungs:    Reduced breath sounds on left; normal respiratory effort CV:         Regular rate and rhythm; no murmurs Abd:      + bowel sounds; soft, non-tender; no palpable masses, no distension Ext:    No edema; adequate peripheral perfusion Skin:      Warm and dry; no rash Neuro: alert and oriented x 3 Psych: normal mood and affect  LABS:  BMET  Recent Labs Lab 12/12/16 0558 12/13/16 0526 12/14/16 0547  NA 137 138 139  K 2.9* 3.4* 3.5  CL 100* 102 104  CO2 26 26 21*  BUN 7 6 5*  CREATININE 0.70 0.67 0.63  GLUCOSE 87 90 81    Electrolytes  Recent Labs Lab 12/11/16 0307 12/12/16 0558 12/13/16 0526 12/14/16 0547  CALCIUM 8.5* 9.0 9.0 9.0  MG 1.3* 1.6* 2.1  --     CBC  Recent Labs Lab 12/12/16 0558 12/13/16 0526 12/14/16 0547  WBC 10.2 8.0 7.7  HGB 11.7* 12.0 12.3  HCT 37.5 38.1 39.6  PLT 412* 387 403*    Coag's  Recent Labs  Lab 12/11/16 0307  APTT 42*  INR 1.33    Sepsis Markers  Recent Labs Lab 12/10/16 2230 12/11/16 0155 12/11/16 0307  LATICACIDVEN 3.00* 2.38* 1.7  PROCALCITON  --   --  <0.10    ABG No results for input(s): PHART, PCO2ART, PO2ART in the last 168 hours.  Liver Enzymes  Recent Labs Lab 12/10/16 2214 12/11/16 0307  AST 37 29  ALT 14 12*  ALKPHOS 66 54  BILITOT 1.8* 1.5*  ALBUMIN 3.3* 2.8*    Cardiac Enzymes  Recent Labs Lab 12/10/16 2214  TROPONINI <0.03    Glucose  Recent Labs Lab 12/11/16 0818 12/12/16 0808  GLUCAP 113* 92    Imaging CT chest 12/11/16 > underfilling of the right lower lobe subsegmental arteries. Large left effusion, left upper lobe infiltrate with mass with mediastinal, hilar  invasion, vascular encasement. CXR 12/12/16 > Mild improvement in left effusion.  I reviewed all images personally.  STUDIES:   CULTURES: Ucx 6/16 > E coli Bcx 6/16 > E colace, bacillus  ANTIBIOTICS: Ceftriaxone 6/17 Cefepime 6/17 >> Azithro 6/18>  SIGNIFICANT EVENTS:   LINES/TUBES:  DISCUSSION: 71 year old ex-smoker with a large left lung mass with mediastinal, hilar, vascular involvement, large left pleural effusion, UTI The CT images is suspicious for malignancy, possible small cell carcinoma. Other possibilities include lt lung pneumonia with para pneumonic effusion but she does not appear infected. I'm not very concerned that there is a PE on the right lower lobe. Patient does not have any hemodynamic or respiratory compromise at present. Pleural cytology is negative.   - Plan for EBUS bronchoscopy today at 10 am  All risks of procedure including hemorrhage, bleeding, pneumothorax, cardiac arrest were discussed with the patient and she agreed to proceed with the bronchoscope.  Marshell Garfinkel MD [facility] Pager [phone] If no answer or after 3pm call: [phone] 12/15/2016, 8:15 AM

## 2016-12-15 NOTE — Anesthesia Procedure Notes (Signed)
Procedure Name: Intubation Date/Time: 12/15/2016 11:24 AM Performed by: Scheryl Darter Pre-anesthesia Checklist: Patient identified, Emergency Drugs available, Suction available and Patient being monitored Patient Re-evaluated:Patient Re-evaluated prior to inductionOxygen Delivery Method: Circle System Utilized Preoxygenation: Pre-oxygenation with 100% oxygen Intubation Type: IV induction Ventilation: Mask ventilation without difficulty Laryngoscope Size: Miller and 2 Grade View: Grade I Tube type: Oral Tube size: 8.5 mm Number of attempts: 1 Airway Equipment and Method: Stylet and Oral airway Placement Confirmation: ETT inserted through vocal cords under direct vision,  positive ETCO2 and breath sounds checked- equal and bilateral Secured at: 22 cm Tube secured with: Tape Dental Injury: Teeth and Oropharynx as per pre-operative assessment

## 2016-12-15 NOTE — Transfer of Care (Signed)
Immediate Anesthesia Transfer of Care Note  Patient: Amanda Macias  Procedure(s) Performed: Procedure(s): VIDEO BRONCHOSCOPY WITH ENDOBRONCHIAL ULTRASOUND (N/A)  Patient Location: PACU  Anesthesia Type:General  Level of Consciousness: awake, alert , oriented and sedated  Airway & Oxygen Therapy: Patient Spontanous Breathing and Patient connected to nasal cannula oxygen  Post-op Assessment: Report given to RN, Post -op Vital signs reviewed and stable and Patient moving all extremities  Post vital signs: Reviewed and stable  Last Vitals:  Vitals:   12/15/16 0610 12/15/16 1258  BP: 139/73   Pulse: 80   Resp: 18   Temp: 36.9 C 36.6 C    Last Pain:  Vitals:   12/15/16 0000  TempSrc: Oral  PainSc:          Complications: No apparent anesthesia complications

## 2016-12-15 NOTE — Anesthesia Postprocedure Evaluation (Signed)
Anesthesia Post Note  Patient: FELICITE ZEIMET  Procedure(s) Performed: Procedure(s) (LRB): VIDEO BRONCHOSCOPY WITH ENDOBRONCHIAL ULTRASOUND (N/A)     Patient location during evaluation: PACU Anesthesia Type: General Level of consciousness: awake and alert Pain management: pain level controlled Vital Signs Assessment: post-procedure vital signs reviewed and stable Respiratory status: spontaneous breathing, nonlabored ventilation and respiratory function stable Cardiovascular status: blood pressure returned to baseline and stable Postop Assessment: no signs of nausea or vomiting Anesthetic complications: no    Last Vitals:  Vitals:   12/15/16 1345 12/15/16 1357  BP:  (!) 121/58  Pulse: 75 78  Resp: 19 20  Temp:      Last Pain:  Vitals:   12/15/16 0000  TempSrc: Oral  PainSc:                  Eitan Doubleday,W. EDMOND

## 2016-12-15 NOTE — Op Note (Addendum)
Video Bronchoscopy with Endobronchial Ultrasound Note  Date of Operation: 04/06/2016  Pre-op Diagnosis: Lung mass and mediastinal LAD  Post-op Diagnosis: Same  Surgeon: Marshell Garfinkel  Assistants:   Anesthesia: General endotracheal anesthesia  Operation: Flexible video fiberoptic bronchoscopy with endobronchial ultrasound and biopsies.  Estimated Blood Loss: Minimal  Complications: None apparent  Indications and History: Mrs Janowicz is a  y.o. female with left upper lobe mass and mediastinal lymphadenopathy on CT chest. Recommendation made to achieve tissue sampling via endobronchial ultrasound guided nodal biopsies. The risks, benefits, complications, treatment options and expected outcomes were discussed with the patient.  The possibilities of pneumothorax, pneumonia, reaction to medication, pulmonary aspiration, perforation of a viscus, bleeding, failure to diagnose a condition and creating a complication requiring transfusion or operation were discussed with the patient who freely signed the consent.    Description of Procedure: The patient was examined in the preoperative area and history and data from the preprocedure consultation were reviewed. It was deemed appropriate to proceed.  The patient was taken to OR 11, identified as Laqueta Due and the procedure verified as Flexible Video Fiberoptic Bronchoscopy.  A Time Out was held and the above information confirmed. After being taken to the operating room general anesthesia was initiated and the patient  was orally intubated. 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 Further down in the left main stem, several fungating, friable lesions were seen. The left lung appears collapsed beyond this and I was unable to pass the scope further due to narrow airway. 2 endobronchial  brushings and 2 endobronchial biopsies were done in the left main stem.  The standard scope was then withdrawn and the endobronchial ultrasound was used to identify and characterize the peritracheal, hilar and bronchial lymph nodes. Inspection showed enlargement of station 7 node. Using real-time ultrasound guidance Wang needle biopsies were take from Station 7 node and was sent for cytology. At the end of the procedure a general airway inspection was performed and there was no evidence of active bleeding. The bronchoscope was removed.  The patient tolerated the procedure well. There was no significant blood loss and there were no obvious complications. A post-procedural chest x-ray is pending.  Samples: 1. Wang needle biopsies from 7 node 2. Endobronchial biopsies from R mainstem bronchus 3. Endobronchial brushings from R mainstem bronchus   Plans:  The patient will be discharged from the PACU to ward when recovered from anesthesia and after chest x-ray is reviewed. We will review the cytology results with the patient when they become available. Recommend consulting medical and radiation oncology for further management.   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     Left main stem bronchus endobronchial lesions    Marshell Garfinkel MD [facility] Pager [phone] If no answer or after 3pm call: [phone] 12/15/2016, 1:00 PM

## 2016-12-15 NOTE — Progress Notes (Addendum)
PROGRESS NOTE   TEIGAN MANNER  WER:[id number]    DOB: 1945-11-01    DOA: 12/10/2016  PCP: System, Pcp Not In   I have briefly reviewed patients previous medical records in [facility].  Brief Narrative:  71 year old female, former smoker, otherwise no known medical history, presented with nonproductive cough, dyspnea, generalized weakness and weight loss. CT chest showed a large left lung mass with invasion of the blood vessels, mediastinal lymph nodes and a large pleural effusion. PCCM consulted and recommended IR for thoracentesis (done) and if cytology negative then consider FOB. Status post thoracentesis 6/18 by IR, Cytology is benign, plan for endoscopy 6/21   Assessment & Plan:   Principal Problem:   Mass of left lung Active Problems:   Cough   Hypokalemia   Sepsis (HCC)   UTI (urinary tract infection)   Pleural effusion  Left lung mass and large left pleural effusion:  - Highly suspicious for bronchogenic carcinoma. Pulmonology consultation appreciated. - status post thoracentesis 6/18 with 2 L drained, looks exudative. - Cytology of pleural effusion with no evidence of malignancy, but lesion on imaging highly suspicious for malignancy, pulmonary input appreciated, plan for endoscopy with biopsy today . - Pleural effusion looks exudative, but Gram stain also remains negative so far, patient with bacteremia, continue with IV antibiotic coverage. 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 Hart Robinsons, prelim pathology report showing small cell, discussed the radiation oncology Dr. Tammi Klippel, he will evaluate for possible need of radiation therapy, discussed with Dr. Learta Codding from medical oncology as well, patient will need to be started on chemotherapy, most likely tomorrow, so we'll transfer patient with a long hospital.  Escherichia coli UTI:  - Urine culture growing Escherichia coli, sensitive to Rocephin, we'll change  cefepime to Rocephin 6/21 .  Bacteremia:  -1 blood culture growing gram variable rods bacillus which is very likely contaminant,  - another blood culture growing Enterobacter colace, unclear source of infection, most likely pulmonary in the setting of abnormal CT findings, lung mass and pleural effusion , but so far Gram stain and cultures of pleural effusion remains negative . - Empirically on IV cefepime, both urine and blood culture sensitive to Rocephin, so transition cefepime to Rocephin today .  Hypokalemia/hypomagnesemia:  - Repleted    Elevated d-dimer:  -Pulmonary input appreciated, low index of suspicion for PE. IV heparin stopped. - lower extremity venous Dopplers negative for DVT   Health screening: HIV: Nonreactive.   DVT prophylaxis: Lovenox  Code Status: Full  Family Communication: None at bedside  Disposition: Need SNF placement, hopefully in 1-2 days  Consultants:  PCCM IR   Procedures:  Diagnostic and therapeutic ultrasound-guided left thoracentesis yielding 2 L on 12/12/16  Subjective: Reports that he could not sleep, reports some dyspnea on exertion, still complains of cough, with a productive clear sputum, denies any hemoptysis, any chest pain.  Objective:  Vitals:   12/14/16 2115 12/14/16 2117 12/15/16 0000 12/15/16 0610  BP: (!) 145/122 (!) 145/72  139/73  Pulse: 92 88  80  Resp: 18   18  Temp: 98.3 F (36.8 C)  99.2 F (37.3 C) 98.5 F (36.9 C)  TempSrc:   Oral   SpO2: 92% 91% 96% 98%  Weight:      Height:      Temperature 98.26F, RR: 18/m  Examination:  Awake alert oriented 3, laying in bed in no apparent distress  Diminished air entry at  the bases, otherwise denies of accessory muscle, no wheezing S1 ,S2 heard, no rubs or gallops, regular rate and rhythm  Bowel sounds present, abdomen soft, nontender, nondistended Extremities no edema, clubbing or cyanosis No focal neurological deficits   Judgement and insight appear normal. Mood &  affect appropriate.     Data Reviewed: I have personally reviewed following labs and imaging studies  CBC:  Recent Labs Lab 12/10/16 2214 12/11/16 0307 12/12/16 0558 12/13/16 0526 12/14/16 0547  WBC 12.4* 10.6* 10.2 8.0 7.7  NEUTROABS 10.4*  --   --   --   --   HGB 13.0 12.0 11.7* 12.0 12.3  HCT 41.3 37.6 37.5 38.1 39.6  MCV 84.8 83.2 83.7 83.7 84.8  PLT 451* 385 412* 387 300*   Basic Metabolic Panel:  Recent Labs Lab 12/10/16 2214 12/11/16 0307 12/12/16 0558 12/13/16 0526 12/14/16 0547  NA 135 132* 137 138 139  K 2.8* 2.8* 2.9* 3.4* 3.5  CL 95* 94* 100* 102 104  CO2 23 22 26 26  21*  GLUCOSE 102* 308* 87 90 81  BUN 10 8 7 6  5*  CREATININE 1.06* 0.88 0.70 0.67 0.63  CALCIUM 9.4 8.5* 9.0 9.0 9.0  MG  --  1.3* 1.6* 2.1  --    Liver Function Tests:  Recent Labs Lab 12/10/16 2214 12/11/16 0307  AST 37 29  ALT 14 12*  ALKPHOS 66 54  BILITOT 1.8* 1.5*  PROT 7.7 6.6  ALBUMIN 3.3* 2.8*   Coagulation Profile:  Recent Labs Lab 12/11/16 0307  INR 1.33   Cardiac Enzymes:  Recent Labs Lab 12/10/16 2214  TROPONINI <0.03   HbA1C: No results for input(s): HGBA1C in the last 72 hours. CBG:  Recent Labs Lab 12/11/16 0818 12/12/16 0808  GLUCAP 113* 92    Recent Results (from the past 240 hour(s))  Urine Culture     Status: Abnormal   Collection Time: 12/10/16 10:08 PM  Result Value Ref Range Status   Specimen Description URINE, RANDOM  Final   Special Requests NONE  Final   Culture >=100,000 COLONIES/mL ESCHERICHIA COLI (A)  Final   Report Status 12/13/2016 FINAL  Final   Organism ID, Bacteria ESCHERICHIA COLI (A)  Final      Susceptibility   Escherichia coli - MIC*    AMPICILLIN >=32 RESISTANT Resistant     CEFAZOLIN <=4 SENSITIVE Sensitive     CEFTRIAXONE <=1 SENSITIVE Sensitive     CIPROFLOXACIN <=0.25 SENSITIVE Sensitive     GENTAMICIN <=1 SENSITIVE Sensitive     IMIPENEM <=0.25 SENSITIVE Sensitive     NITROFURANTOIN <=16 SENSITIVE  Sensitive     TRIMETH/SULFA <=20 SENSITIVE Sensitive     AMPICILLIN/SULBACTAM 16 INTERMEDIATE Intermediate     PIP/TAZO <=4 SENSITIVE Sensitive     Extended ESBL NEGATIVE Sensitive     * >=100,000 COLONIES/mL ESCHERICHIA COLI  Blood culture (routine x 2)     Status: Abnormal   Collection Time: 12/10/16 10:15 PM  Result Value Ref Range Status   Specimen Description BLOOD RIGHT ARM  Final   Special Requests   Final    BOTTLES DRAWN AEROBIC AND ANAEROBIC Blood Culture adequate volume   Culture  Setup Time   Final    GRAM NEGATIVE RODS IN BOTH AEROBIC AND ANAEROBIC BOTTLES CRITICAL RESULT CALLED TO, READ BACK BY AND VERIFIED WITH: Ellin Mayhew [place] [phone] MLM    Culture ENTEROBACTER CLOACAE (A)  Final   Report Status 12/13/2016 FINAL  Final   Organism  ID, Bacteria ENTEROBACTER CLOACAE  Final      Susceptibility   Enterobacter cloacae - MIC*    CEFAZOLIN >=64 RESISTANT Resistant     CEFEPIME <=1 SENSITIVE Sensitive     CEFTAZIDIME <=1 SENSITIVE Sensitive     CEFTRIAXONE <=1 SENSITIVE Sensitive     CIPROFLOXACIN <=0.25 SENSITIVE Sensitive     GENTAMICIN <=1 SENSITIVE Sensitive     IMIPENEM <=0.25 SENSITIVE Sensitive     TRIMETH/SULFA <=20 SENSITIVE Sensitive     PIP/TAZO <=4 SENSITIVE Sensitive     * ENTEROBACTER CLOACAE  Blood Culture ID Panel (Reflexed)     Status: Abnormal   Collection Time: 12/10/16 10:15 PM  Result Value Ref Range Status   Enterococcus species NOT DETECTED NOT DETECTED Final   Listeria monocytogenes NOT DETECTED NOT DETECTED Final   Staphylococcus species NOT DETECTED NOT DETECTED Final   Staphylococcus aureus NOT DETECTED NOT DETECTED Final   Streptococcus species NOT DETECTED NOT DETECTED Final   Streptococcus agalactiae NOT DETECTED NOT DETECTED Final   Streptococcus pneumoniae NOT DETECTED NOT DETECTED Final   Streptococcus pyogenes NOT DETECTED NOT DETECTED Final   Acinetobacter baumannii NOT DETECTED NOT DETECTED Final   Enterobacteriaceae species  DETECTED (A) NOT DETECTED Final    Comment: Enterobacteriaceae represent a large family of gram-negative bacteria, not a single organism. CRITICAL RESULT CALLED TO, READ BACK BY AND VERIFIED WITH: PHARMD M Bishop [phone] MLM    Enterobacter cloacae complex DETECTED (A) NOT DETECTED Final    Comment: CRITICAL RESULT CALLED TO, READ BACK BY AND VERIFIED WITH: PHARMD M [place] [phone] MLM    Escherichia coli NOT DETECTED NOT DETECTED Final   Klebsiella oxytoca NOT DETECTED NOT DETECTED Final   Klebsiella pneumoniae NOT DETECTED NOT DETECTED Final   Proteus species NOT DETECTED NOT DETECTED Final   Serratia marcescens NOT DETECTED NOT DETECTED Final   Carbapenem resistance NOT DETECTED NOT DETECTED Final   Haemophilus influenzae NOT DETECTED NOT DETECTED Final   Neisseria meningitidis NOT DETECTED NOT DETECTED Final   Pseudomonas aeruginosa NOT DETECTED NOT DETECTED Final   Candida albicans NOT DETECTED NOT DETECTED Final   Candida glabrata NOT DETECTED NOT DETECTED Final   Candida krusei NOT DETECTED NOT DETECTED Final   Candida parapsilosis NOT DETECTED NOT DETECTED Final   Candida tropicalis NOT DETECTED NOT DETECTED Final  Blood culture (routine x 2)     Status: Abnormal   Collection Time: 12/10/16 10:50 PM  Result Value Ref Range Status   Specimen Description BLOOD RIGHT HAND  Final   Special Requests IN PEDIATRIC BOTTLE Blood Culture adequate volume  Final   Culture  Setup Time (A)  Final    GRAM VARIABLE ROD IN PEDIATRIC BOTTLE CRITICAL RESULT CALLED TO, READ BACK BY AND VERIFIED WITH: Hughie Closs, PHARMD AT 1244 12/11/16 BY  L BENFIELD    Culture (A)  Final    BACILLUS SPECIES Standardized susceptibility testing for this organism is not available.    Report Status 12/13/2016 FINAL  Final  Blood Culture ID Panel (Reflexed)     Status: None   Collection Time: 12/10/16 10:50 PM  Result Value Ref Range Status   Enterococcus species NOT DETECTED NOT DETECTED Final    Listeria monocytogenes NOT DETECTED NOT DETECTED Final   Staphylococcus species NOT DETECTED NOT DETECTED Final   Staphylococcus aureus NOT DETECTED NOT DETECTED Final   Streptococcus species NOT DETECTED NOT DETECTED Final   Streptococcus agalactiae NOT DETECTED NOT DETECTED Final   Streptococcus  pneumoniae NOT DETECTED NOT DETECTED Final   Streptococcus pyogenes NOT DETECTED NOT DETECTED Final   Acinetobacter baumannii NOT DETECTED NOT DETECTED Final   Enterobacteriaceae species NOT DETECTED NOT DETECTED Final   Enterobacter cloacae complex NOT DETECTED NOT DETECTED Final   Escherichia coli NOT DETECTED NOT DETECTED Final   Klebsiella oxytoca NOT DETECTED NOT DETECTED Final   Klebsiella pneumoniae NOT DETECTED NOT DETECTED Final   Proteus species NOT DETECTED NOT DETECTED Final   Serratia marcescens NOT DETECTED NOT DETECTED Final   Haemophilus influenzae NOT DETECTED NOT DETECTED Final   Neisseria meningitidis NOT DETECTED NOT DETECTED Final   Pseudomonas aeruginosa NOT DETECTED NOT DETECTED Final   Candida albicans NOT DETECTED NOT DETECTED Final   Candida glabrata NOT DETECTED NOT DETECTED Final   Candida krusei NOT DETECTED NOT DETECTED Final   Candida parapsilosis NOT DETECTED NOT DETECTED Final   Candida tropicalis NOT DETECTED NOT DETECTED Final  Culture, expectorated sputum-assessment     Status: None   Collection Time: 12/11/16  6:14 AM  Result Value Ref Range Status   Specimen Description EXPECTORATED SPUTUM  Final   Special Requests NONE  Final   Sputum evaluation   Final    Sputum specimen not acceptable for testing.  Please recollect.   RESULT CALLED TO, READ BACK BY AND VERIFIED WITH: Madelin Rear RN 11:30 12/14/16 ([email or web address])    Report Status 12/14/2016 FINAL  Final  Gram stain     Status: None   Collection Time: 12/12/16  4:16 PM  Result Value Ref Range Status   Specimen Description PLEURAL LEFT  Final   Special Requests NONE  Final   Gram Stain   Final    FEW WBC  PRESENT, PREDOMINANTLY MONONUCLEAR NO ORGANISMS SEEN    Report Status 12/13/2016 FINAL  Final  Culture, body fluid-bottle     Status: None (Preliminary result)   Collection Time: 12/12/16  4:16 PM  Result Value Ref Range Status   Specimen Description PLEURAL LEFT  Final   Special Requests BOTTLES DRAWN AEROBIC AND ANAEROBIC  Final   Culture NO GROWTH 2 DAYS  Final   Report Status PENDING  Incomplete  Culture, blood (Routine X 2) w Reflex to ID Panel     Status: None (Preliminary result)   Collection Time: 12/13/16  4:42 PM  Result Value Ref Range Status   Specimen Description BLOOD LEFT HAND  Final   Special Requests IN PEDIATRIC BOTTLE Blood Culture adequate volume  Final   Culture NO GROWTH < 24 HOURS  Final   Report Status PENDING  Incomplete  Culture, blood (Routine X 2) w Reflex to ID Panel     Status: None (Preliminary result)   Collection Time: 12/13/16  4:47 PM  Result Value Ref Range Status   Specimen Description BLOOD LEFT HAND  Final   Special Requests IN PEDIATRIC BOTTLE Blood Culture adequate volume  Final   Culture NO GROWTH < 24 HOURS  Final   Report Status PENDING  Incomplete  Surgical pcr screen     Status: None   Collection Time: 12/14/16  7:00 PM  Result Value Ref Range Status   MRSA, PCR NEGATIVE NEGATIVE Final   Staphylococcus aureus NEGATIVE NEGATIVE Final    Comment:        The Xpert SA Assay (FDA approved for NASAL specimens in patients over 30 years of age), is one component of a comprehensive surveillance program.  Test performance has been validated by [facility] for patients greater  than or equal to 22 year old. It is not intended to diagnose infection nor to guide or monitor treatment.   Culture, expectorated sputum-assessment     Status: None   Collection Time: 12/15/16  2:23 AM  Result Value Ref Range Status   Specimen Description Expect. Sput  Final   Special Requests NONE  Final   Sputum evaluation   Final    Sputum specimen not  acceptable for testing.  Please recollect.   Gram Stain Report Called to,Read Back By and Verified With: S. WARD RN, AT [phone] 12/15/16 BY Rush Landmark    Report Status 12/15/2016 FINAL  Final         Radiology Studies: No results found.      Scheduled Meds: . [MAR Hold] dextromethorphan-guaiFENesin  1 tablet Oral BID  . [MAR Hold] enoxaparin (LOVENOX) injection  0.5 mg/kg Subcutaneous Q24H  . [MAR Hold] senna-docusate  2 tablet Oral BID  . [MAR Hold] sodium chloride flush  3 mL Intravenous Q12H   Continuous Infusions: . sodium chloride 75 mL/hr at 12/13/16 1609  . [MAR Hold] cefTRIAXone (ROCEPHIN)  IV    . lactated ringers 50 mL/hr at 12/15/16 1001     LOS: 4 days     Phillips Climes, MD [facility] Pager [phone]  If 7PM-7AM, please contact night-coverage [email or web address] Password [facility] 12/15/2016, 11:21 AM

## 2016-12-15 NOTE — Progress Notes (Signed)
Physical Therapy Cancellation Note  PT will check on pt later as time allows.    12/15/16 1033  PT Visit Information  Last PT Received On 12/15/16  Reason Eval/Treat Not Completed Patient at procedure or test/unavailable  History of Present Illness Pt is a 71 y/o female admitted secondary to L lung mass, UTI,  and hypokalemia. Pt had persistent cough, nasal congestions, fatigue, nausea, diarrhea, and fever. PMH includes former smoker.    Earney Navy, PTA Pager: [phone]

## 2016-12-15 NOTE — Consult Note (Signed)
New Hematology/Oncology Consult   Referral RX:[id number] [facility]      Reason for Referral: Lung cancer   HPI:  Amanda Macias presented to the emergency room on 12/10/2016 with a cough and dyspnea. She reports progressive dyspnea for several weeks. A chest x-ray revealed opacification of the left hemithorax. A CT revealed a large left pleural effusion with a large left upper lobe infiltrative mass. Mediastinal and and hilar invasion was noted. The left pulmonary artery is encased by tumor. Venous obstruction in the upper mediastinum with extensive collateral vessels was noted. Soft tissue density in the subcarinal region and left hilum consistent with tumor versus matted adenopathy.  She underwent a left thoracentesis for 2 L of serosanguineous fluid on 12/12/2016. The cytology returned negative.  Pulmonary medicine was consulted and she was taken to bronchoscopy procedure todayBy Dr. Vaughan Browner. There is 50% narrowing of the left mainstem bronchus by extrinsic compression. A fungating friable lesions were noted in the left mainstem with the left lung collapsed precluding further passage of the bronchoscope. Endobronchial brushings and biopsies were obtained. An endobronchial ultrasound revealed enlargement of a station 7 lymph node. The node was biopsied.  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  Amanda Macias has no complaint aside from dyspnea.     Past Medical History:  Diagnosis Date  . G0 P0  11/2016  .  11/2016  . UTI (urinary tract infection) 11/2016  :  Past Surgical History:  Procedure Laterality Date  . CHOLECYSTECTOMY    . IR THORACENTESIS ASP PLEURAL SPACE W/IMG GUIDE  12/12/2016  :   Current Facility-Administered Medications:  .  0.9 %  sodium chloride infusion, , Intravenous, Continuous, Ivor Costa, MD, Last Rate: 75 mL/hr at 12/13/16 1609 .  acetaminophen (TYLENOL) tablet 650 mg, 650 mg, Oral, Q6H PRN **OR** acetaminophen (TYLENOL) suppository 650 mg, 650  mg, Rectal, Q6H PRN, Ivor Costa, MD .  albuterol (PROVENTIL) (2.5 MG/3ML) 0.083% nebulizer solution 2.5 mg, 2.5 mg, Nebulization, Q4H PRN, Ivor Costa, MD .  cefTRIAXone (ROCEPHIN) 2 g in dextrose 5 % 50 mL IVPB, 2 g, Intravenous, Q24H, Elgergawy, Silver Huguenin, MD, Stopped at 12/15/16 1540 .  dextromethorphan-guaiFENesin (MUCINEX DM) 30-600 MG per 12 hr tablet 1 tablet, 1 tablet, Oral, BID, Ivor Costa, MD, 1 tablet at 12/14/16 2238 .  docusate sodium (COLACE) capsule 100 mg, 100 mg, Oral, Daily PRN, Elgergawy, Dawood S, MD .  enoxaparin (LOVENOX) injection 55 mg, 0.5 mg/kg, Subcutaneous, Q24H, Hongalgi, Anand D, MD, 55 mg at 12/14/16 1330 .  lactated ringers infusion, , Intravenous, Continuous, Roderic Palau, MD, Last Rate: 50 mL/hr at 12/15/16 1001 .  lidocaine (XYLOCAINE) 1 % (with pres) injection, , , PRN, Saverio Danker, PA-C, 10 mL at 12/12/16 1546 .  nystatin (MYCOSTATIN/NYSTOP) topical powder, , Topical, TID, Elgergawy, Silver Huguenin, MD .  ondansetron (ZOFRAN) tablet 4 mg, 4 mg, Oral, Q6H PRN **OR** ondansetron (ZOFRAN) injection 4 mg, 4 mg, Intravenous, Q6H PRN, Ivor Costa, MD .  senna-docusate (Senokot-S) tablet 2 tablet, 2 tablet, Oral, BID, Elgergawy, Silver Huguenin, MD .  sodium chloride flush (NS) 0.9 % injection 3 mL, 3 mL, Intravenous, Q12H, Ivor Costa, MD, 3 mL at 12/14/16 2200 .  zolpidem (AMBIEN) tablet 5 mg, 5 mg, Oral, QHS PRN, Ivor Costa, MD:  . dextromethorphan-guaiFENesin  1 tablet Oral BID  . enoxaparin (LOVENOX) injection  0.5 mg/kg Subcutaneous Q24H  . nystatin   Topical TID  . senna-docusate  2 tablet Oral BID  . sodium chloride flush  3 mL Intravenous Q12H  :  Allergies  Allergen Reactions  . No Known Allergies   :  FH:No family history of cancer   SOCIAL HISTORY: she lives alone in [place]. She has several brothers in town. She is retired Librarian, academic from a nursing facility. She quit smoking cigarettes 15 years ago. No alcohol use. No transfusion history. No risk  factor for HIV or hepatitis.   Review of Systems:  Positives include:Dyspnea, cough  A complete ROS was otherwise negative.   Physical Exam:  Blood pressure 123/77, pulse 93, temperature 98.7 F (37.1 C), resp. rate 20, height 5\' 5"  (1.651 m), weight 246 lb 12.8 oz (111.9 kg), SpO2 100 %.  HEENT: No thrush, neck without mass, mild neck edema  Lungs: Diminished breath sounds throughout the left chest, no respiratory distress  Cardiac: Regular rate and rhythm  Abdomen: No hepatosplenomegaly, no mass, nontender   Vascular: No leg edema  Lymph nodes: Firm left supraclavicular node, multiple enlarged left axillary nodes. No cervical, right supraclavicular, right axillary, or inguinal nodes  Neurologic: Alert and oriented, the motor exam appears intact in the upper and lower extremities  Skin: Yeast rash in the groin bilaterally  Musculoskeletal: No spine tenderness  Rest: Bilateral breast without mass   LABS:   Recent Labs  12/13/16 0526 12/14/16 0547  WBC 8.0 7.7  HGB 12.0 12.3  HCT 38.1 39.6  PLT 387 403*     Recent Labs  12/13/16 0526 12/14/16 0547  NA 138 139  K 3.4* 3.5  CL 102 104  CO2 26 21*  GLUCOSE 90 81  BUN 6 5*  CREATININE 0.67 0.63  CALCIUM 9.0 9.0      RADIOLOGY:  Dg Chest 1 View  Result Date: 12/12/2016 CLINICAL DATA:  Thoracentesis. EXAM: CHEST 1 VIEW COMPARISON:  Ultrasound same day. CT chest 12/11/2016. Chest x-ray 12/10/2016. FINDINGS: Interim decrease in left pleural effusion. No pneumothorax post thoracentesis . Prominent persistent opacification of the left hemithorax remains. IMPRESSION: Interim decrease in left-sided pleural effusion. No pneumothorax following left-sided thoracentesis. Electronically Signed   By: Marcello Moores  Register   On: 12/12/2016 16:33   Dg Chest 2 View  Result Date: 12/10/2016 CLINICAL DATA:  Cough and nasal congestion for 1 month. EXAM: CHEST  2 VIEW COMPARISON:  None. FINDINGS: There is complete opacification of the  left hemithorax. No right-sided pneumothorax. The visualized right lung is clear. The cardiomediastinal silhouette is partially obscured by the left-sided opacification but no abnormalities are seen. IMPRESSION: Complete opacification of the left hemithorax. A CT scan could further evaluate if clinically warranted. Electronically Signed   By: Dorise Bullion III M.D   On: 12/10/2016 22:18   Ct Angio Chest Pe W Or Wo Contrast  Result Date: 12/11/2016 CLINICAL DATA:  Follow-up abnormal chest x-ray EXAM: CT ANGIOGRAPHY CHEST WITH CONTRAST TECHNIQUE: Multidetector CT imaging of the chest was performed using the standard protocol during bolus administration of intravenous contrast. Multiplanar CT image reconstructions and MIPs were obtained to evaluate the vascular anatomy. CONTRAST:  80 mL Isovue 370 intravenous COMPARISON:  Radiograph 12/10/2016 FINDINGS: Cardiovascular: Excessive respiratory motion artifact limits the examination. No central embolus is visualized. Tiny hypodensities within sub segmental right lower lobe branch vessels could be due to artifact although tiny emboli are not excluded. Non aneurysmal aorta. Atherosclerosis. No dissection is seen. There is narrowing of the left pulmonary artery and multiple left upper lobe branch vessels due to a large mass in the lung. There is tumor in case min  and narrowing of the left common carotid and subclavian vessels. Central venous obstruction by tumor with multiple chest wall and paraspinal collateral vessels filling the SVC and brachiocephalic vessels. Mediastinum/Nodes: Midline trachea. Right lobe of thyroid within normal limits. Poorly defined left lobe of thyroid. Esophagus unremarkable. Infiltrative soft tissue mass extends into the superior mediastinum/ thoracic inlet and involves the left hilar structures. Tumor or matted adenopathy within the precarinal space. Soft tissue mass in the subcarinal region measuring at least 2.9 cm. Slight narrowed  appearance of the left bronchus. Lungs/Pleura: Large left pleural effusion with mild shift to the right. There is atelectasis in the left lower lobe. Ill-defined soft tissue mass in the left upper lobe, precise measurements difficult due to the presence of adjacent pleural effusion and poorly defined appearance of the mass. Mass measures at least 8.6 cm transverse by 9.1 cm AP by 12.8 cm craniocaudad. Upper Abdomen: Surgical clips in the gallbladder fossa. No acute abnormality is seen. Musculoskeletal: No acute osseous abnormality. Review of the MIP images confirms the above findings. IMPRESSION: 1. Excessive respiratory motion artifact limits the study. No central embolus is visualized. Under filling of subsegmental right lower lobe pulmonary artery branch vessels could be secondary to artifact although small emboli are difficult to exclude. 2. Large left pleural effusion. Large left upper lobe infiltrative lung mass extending into the left apex. There is mediastinal and hilar invasion by the mass. Vascular encasement of the left pulmonary artery and upper lobe arterial branches by tumor. Tumor encasement of the great vessels of the aortic arch, primarily the left common carotid and subclavian vessels. 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 3. Soft tissue density within the left hilus and subcarinal region could relate to additional tumor or matted adenopathy. Aortic Atherosclerosis (ICD10-I70.0). Electronically Signed   By: Donavan Foil M.D.   On: 12/11/2016 02:07   Dg Chest Port 1 View  Result Date: 12/15/2016 CLINICAL DATA:  Status post bronchoscopy. The patient is experiencing coughing and shortness of breath. EXAM: PORTABLE CHEST 1 VIEW COMPARISON:  Chest x-ray of December 12, 2016 FINDINGS: There is complete opacification of the left hemithorax today. There is no significant mediastinal shift. The right  lung is clear. The right heart border is normal and the pulmonary vascularity is not engorged. There is degenerative change of both shoulders. IMPRESSION: Complete opacification of the left hemithorax compatible with total lung atelectasis and/or pleural fluid. There is no significant mediastinal shift. Electronically Signed   By: David  Martinique M.D.   On: 12/15/2016 13:36   Ir Thoracentesis Asp Pleural Space W/img Guide  Result Date: 12/12/2016 INDICATION: New left-sided pleural effusion with possible left lung mass. Request is made for diagnostic and therapeutic thoracentesis. EXAM: ULTRASOUND GUIDED DIAGNOSTIC AND THERAPEUTIC THORACENTESIS MEDICATIONS: 1% lidocaine COMPLICATIONS: None immediate. PROCEDURE: An ultrasound guided thoracentesis was thoroughly discussed with the patient and questions answered. The benefits, risks, alternatives and complications were also discussed. The patient understands and wishes to proceed with the procedure. Written consent was obtained. Ultrasound was performed to localize and mark an adequate pocket of fluid in the left chest. The area was then prepped and draped in the normal sterile fashion. 1% Lidocaine was used for local anesthesia. Under ultrasound guidance a Safe-T-Centesis catheter was introduced. Thoracentesis was performed. The catheter was removed and a dressing applied. FINDINGS: A total of approximately 2 L of serosanguineous fluid was removed. Samples were sent to the  laboratory as requested by the clinical team. IMPRESSION: Successful ultrasound guided left thoracentesis yielding 2 L of pleural fluid. Read by: Saverio Danker, PA-C Electronically Signed   By: Marybelle Killings M.D.   On: 12/12/2016 16:46    Assessment and Plan:   1. Left lung/mediastinal mass with a large left pleural effusion  -negative pleural fluid cytology 12/12/2016  Bronchoscopy/EBUS on 12/15/2016-extrinsic compression of the left mainstem bronchus with distal endobronchial tumor,  status post endobronchial and level 7 node biopsies  Left neck and left axillary lymph nodes on physical exam 12/15/2016 2. 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 Dyspnea/cough secondary to #1  The clinical presentation and x-ray findings are consistent with a diagnosis of small cell lung cancer. I reviewed the CT images.  I discussed the probable diagnosis of small cell lung cancer with Amanda Macias. She is scheduled for consultation with radiation oncology later today. I will recommend additional staging evaluation. I recommend transfer to the oncology unit at [facility] in anticipation of beginning systemic chemotherapy within the next 1-2 days.  Recommendations: 1. Transferred to [facility] for oncology unit 2. Staging brain MRI when patient is able to undergo this study 3. Staging bone scan and CT abdomen 4. Follow-up final pathology report and initiate systemic therapy within the next 1-2 days  Donneta Romberg, MD 12/15/2016, 5:45 PM

## 2016-12-15 NOTE — Anesthesia Preprocedure Evaluation (Addendum)
Anesthesia Evaluation  Patient identified by MRN, date of birth, ID band Patient awake    Reviewed: Allergy & Precautions, H&P , NPO status , Patient's Chart, lab work & pertinent test results  Airway Mallampati: III  TM Distance: >3 FB Neck ROM: Full    Dental no notable dental hx. (+) Edentulous Upper, Partial Lower, Dental Advisory Given   Pulmonary neg pulmonary ROS, shortness of breath and at rest, former smoker,    Pulmonary exam normal breath sounds clear to auscultation       Cardiovascular negative cardio ROS   Rhythm:Regular Rate:Normal     Neuro/Psych negative neurological ROS  negative psych ROS   GI/Hepatic negative GI ROS, Neg liver ROS,   Endo/Other  Morbid obesity  Renal/GU negative Renal ROS  negative genitourinary   Musculoskeletal   Abdominal   Peds  Hematology negative hematology ROS (+)   Anesthesia Other Findings   Reproductive/Obstetrics negative OB ROS                            Anesthesia Physical Anesthesia Plan  ASA: III  Anesthesia Plan: General   Post-op Pain Management:    Induction: Intravenous  PONV Risk Score and Plan: 4 or greater and Ondansetron, Dexamethasone, Propofol and Midazolam  Airway Management Planned: Oral ETT  Additional Equipment:   Intra-op Plan:   Post-operative Plan: Extubation in OR  Informed Consent: I have reviewed the patients History and Physical, chart, labs and discussed the procedure including the risks, benefits and alternatives for the proposed anesthesia with the patient or authorized representative who has indicated his/her understanding and acceptance.   Dental advisory given  Plan Discussed with: CRNA  Anesthesia Plan Comments:        Anesthesia Quick Evaluation

## 2016-12-16 ENCOUNTER — Inpatient Hospital Stay (HOSPITAL_COMMUNITY): Payer: Medicare Other

## 2016-12-16 ENCOUNTER — Encounter (HOSPITAL_COMMUNITY): Payer: Self-pay | Admitting: Pulmonary Disease

## 2016-12-16 DIAGNOSIS — C3490 Malignant neoplasm of unspecified part of unspecified bronchus or lung: Secondary | ICD-10-CM | POA: Diagnosis present

## 2016-12-16 LAB — COMPREHENSIVE METABOLIC PANEL
ALBUMIN: 3 g/dL — AB (ref 3.5–5.0)
ALT: 14 U/L (ref 14–54)
AST: 26 U/L (ref 15–41)
Alkaline Phosphatase: 49 U/L (ref 38–126)
Anion gap: 10 (ref 5–15)
BUN: 7 mg/dL (ref 6–20)
CHLORIDE: 97 mmol/L — AB (ref 101–111)
CO2: 27 mmol/L (ref 22–32)
Calcium: 8.7 mg/dL — ABNORMAL LOW (ref 8.9–10.3)
Creatinine, Ser: 0.58 mg/dL (ref 0.44–1.00)
GFR calc non Af Amer: >60 mL/min (ref >60)
GLUCOSE: 87 mg/dL (ref 65–99)
Potassium: 2.9 mmol/L — ABNORMAL LOW (ref 3.5–5.1)
SODIUM: 134 mmol/L — AB (ref 135–145)
Total Bilirubin: 0.9 mg/dL (ref 0.3–1.2)
Total Protein: 6.6 g/dL (ref 6.5–8.1)

## 2016-12-16 LAB — MAGNESIUM: Magnesium: 1.6 mg/dL — ABNORMAL LOW (ref 1.7–2.4)

## 2016-12-16 MED ORDER — SODIUM CHLORIDE 0.9% FLUSH: 10.0000 mL | INTRAVENOUS | Status: DC | PRN

## 2016-12-16 MED ORDER — SODIUM CHLORIDE 0.9% FLUSH
10.0000 mL | Freq: Two times a day (BID) | INTRAVENOUS | Status: DC
  Administered 2016-12-17 – 2016-12-18 (×4): 10 mL

## 2016-12-16 MED ORDER — PALONOSETRON HCL INJECTION 0.25 MG/5ML
0.2500 mg | Freq: Once | INTRAVENOUS | Status: AC
  Administered 2016-12-16: 0.25 mg via INTRAVENOUS
  Filled 2016-12-16: qty 5

## 2016-12-16 MED ORDER — POTASSIUM CHLORIDE 10 MEQ/100ML IV SOLN
10.0000 meq | INTRAVENOUS | Status: AC
  Filled 2016-12-16 (×6): qty 100

## 2016-12-16 MED ORDER — IOPAMIDOL (ISOVUE-300) INJECTION 61%: 15.0000 mL | Freq: Once | INTRAVENOUS | Status: AC | PRN

## 2016-12-16 MED ORDER — DEXTROSE 5 % IV SOLN: 1.0000 g | INTRAVENOUS | Status: DC

## 2016-12-16 MED ORDER — SODIUM CHLORIDE 0.9 % IV SOLN
10.0000 mg | Freq: Once | INTRAVENOUS | Status: AC
  Administered 2016-12-16: 10 mg via INTRAVENOUS
  Filled 2016-12-16: qty 1

## 2016-12-16 MED ORDER — ALTEPLASE 2 MG IJ SOLR
2.0000 mg | Freq: Once | INTRAMUSCULAR | Status: DC | PRN
  Filled 2016-12-16: qty 2

## 2016-12-16 MED ORDER — HEPARIN SOD (PORK) LOCK FLUSH 100 UNIT/ML IV SOLN: 500.0000 [IU] | Freq: Once | INTRAVENOUS | Status: DC | PRN

## 2016-12-16 MED ORDER — HOT PACK MISC ONCOLOGY
1.0000 | Freq: Once | Status: AC | PRN
  Filled 2016-12-16: qty 1

## 2016-12-16 MED ORDER — HEPARIN SOD (PORK) LOCK FLUSH 100 UNIT/ML IV SOLN: 250.0000 [IU] | Freq: Once | INTRAVENOUS | Status: DC | PRN

## 2016-12-16 MED ORDER — SODIUM CHLORIDE 0.9% FLUSH: 3.0000 mL | INTRAVENOUS | Status: DC | PRN

## 2016-12-16 MED ORDER — SODIUM CHLORIDE 0.9 % IV SOLN
80.0000 mg/m2 | Freq: Once | INTRAVENOUS | Status: AC
  Administered 2016-12-16: 180 mg via INTRAVENOUS
  Filled 2016-12-16: qty 9

## 2016-12-16 MED ORDER — IOPAMIDOL (ISOVUE-300) INJECTION 61%
INTRAVENOUS | Status: AC
  Filled 2016-12-16: qty 100

## 2016-12-16 MED ORDER — TECHNETIUM TC 99M MEDRONATE IV KIT
21.7000 | PACK | Freq: Once | INTRAVENOUS | Status: AC | PRN
  Administered 2016-12-16: 21.7 via INTRAVENOUS

## 2016-12-16 MED ORDER — SODIUM CHLORIDE 0.9 % IV SOLN: Freq: Once | INTRAVENOUS | Status: AC

## 2016-12-16 MED ORDER — POTASSIUM CHLORIDE 10 MEQ/100ML IV SOLN
10.0000 meq | INTRAVENOUS | Status: AC
  Administered 2016-12-16 – 2016-12-17 (×6): 10 meq via INTRAVENOUS

## 2016-12-16 MED ORDER — ONDANSETRON HCL 4 MG/2ML IJ SOLN: 4.0000 mg | Freq: Four times a day (QID) | INTRAMUSCULAR | Status: DC | PRN

## 2016-12-16 MED ORDER — CARBOPLATIN CHEMO INJECTION 600 MG/60ML
490.8000 mg | Freq: Once | INTRAVENOUS | Status: AC
  Administered 2016-12-16: 490 mg via INTRAVENOUS
  Filled 2016-12-16: qty 49

## 2016-12-16 MED ORDER — IOPAMIDOL (ISOVUE-300) INJECTION 61%
INTRAVENOUS | Status: AC
  Filled 2016-12-16: qty 30

## 2016-12-16 MED ORDER — ONDANSETRON HCL 4 MG PO TABS: 4.0000 mg | ORAL_TABLET | Freq: Four times a day (QID) | ORAL | Status: DC | PRN

## 2016-12-16 MED ORDER — IOPAMIDOL (ISOVUE-300) INJECTION 61%
100.0000 mL | Freq: Once | INTRAVENOUS | Status: AC | PRN
  Administered 2016-12-16: 100 mL via INTRAVENOUS

## 2016-12-16 NOTE — Progress Notes (Signed)
Manual calculation of BSA and dosing for chemotherapy completed.  Verified by Jena Gauss RN and Reyne Dumas RN

## 2016-12-16 NOTE — Consult Note (Signed)
Radiation Oncology         [phone]) [phone] ________________________________  Initial inpatient Consultation  Name: Amanda Macias MRN: [id number]  Date: 12/10/2016  DOB: [birth year]  [id number], Pcp Not In  No ref. provider found   REFERRING PHYSICIAN: No ref. provider foundDr. Waldron Labs  DIAGNOSIS: The primary encounter diagnosis was Mass of left lung. Diagnoses of Pleural effusion, Dyspnea, S/P thoracentesis, Status post bronchoscopy with biopsy, Status post bronchoscopy, Lung cancer ([place]), and Small cell lung cancer ([place]) were also pertinent to this visit.    ICD-10-CM   1. Mass of left lung R91.8   2. Pleural effusion J90   3. Dyspnea R06.00 IR THORACENTESIS ASP PLEURAL SPACE W/IMG GUIDE    IR THORACENTESIS ASP PLEURAL SPACE W/IMG GUIDE    CANCELED: IR Radiologist Eval & Mgmt    CANCELED: IR Radiologist Eval & Mgmt  4. S/P thoracentesis Z98.890 DG Chest 1 View    DG Chest 1 View  5. Status post bronchoscopy with biopsy Z98.890 CANCELED: DG Chest 2 View    CANCELED: DG Chest 2 View  6. Status post bronchoscopy Z98.890 DG CHEST PORT 1 VIEW    DG CHEST PORT 1 VIEW  7. Lung cancer ([place]) C34.90   8. Small cell lung cancer ([facility]) C34.90 0.9 %  sodium chloride infusion    sodium chloride flush (NS) 0.9 % injection 10 mL    heparin lock flush 100 unit/mL    heparin lock flush 100 unit/mL    alteplase (CATHFLO ACTIVASE) injection 2 mg    sodium chloride flush (NS) 0.9 % injection 3 mL    dexamethasone (DECADRON) 10 mg in sodium chloride 0.9 % 50 mL IVPB    palonosetron (ALOXI) injection 0.25 mg    etoposide (VEPESID) 180 mg in sodium chloride 0.9 % 500 mL chemo infusion    CARBOplatin (PARAPLATIN) 490 mg in sodium chloride 0.9 % 250 mL chemo infusion    Hot Pack 1 packet    HISTORY OF PRESENT ILLNESS: Amanda Macias is a 71 y.o. female seen at the request of Dr. Waldron Labs for evaluation and consideration of radiotherapy for management of her newly diagnosed small cell lung cancer. She  presented to the emergency room on 12/10/2016 with complaints of persistent nonproductive cough, dyspnea, progressive generalized weakness and weight loss ongoing for the past month.  CT chest was performed at that time and revealed a large pleural effusion with an 8.6 x 9.1 x 12.8 cm, infiltrative left lung mass extending into the left apex with mediastinal and hilar invasion as well as vascular encasement of the left pulmonary artery and upper lobe arterial branches by the tumor. Venous obstruction in the upper mediastinum with extensive collateral vessels was also noted as well as soft tissue density within the left hilum and subcarinal region consistent with tumor versus matted adenopathy.  Ultrasound-guided diagnostic and therapeutic left thoracentesis was performed on 12/12/2016. 2 L of serosanguineous fluid was removed and cytology returned negative.  She underwent bronchoscopy with EBUS for biopsy and tissue sampling on 12/15/2016 by Dr. Vaughan Browner.  Pathology returned poorly differentiated carcinoma with features favoring small cell carcinoma involving the left mainstem bronchus and a station 7 node.  Dr. Benay Spice has evaluated the patient with plans to proceed with systemic chemotherapy with etoposide/carboplatin today. He has also ordered a CT abdomen and bone scan for further disease staging.  We are seeing the patient today to discuss the potential role of radiotherapy in the management of her disease.  PREVIOUS  RADIATION THERAPY: No  PAST MEDICAL HISTORY:  Past Medical History:  Diagnosis Date  . Hypokalemia 11/2016  . Mass of left lung 11/2016  . UTI (urinary tract infection) 11/2016      PAST SURGICAL HISTORY: Past Surgical History:  Procedure Laterality Date  . CHOLECYSTECTOMY    . IR THORACENTESIS ASP PLEURAL SPACE W/IMG GUIDE  12/12/2016  . VIDEO BRONCHOSCOPY WITH ENDOBRONCHIAL ULTRASOUND N/A 12/15/2016   Procedure: VIDEO BRONCHOSCOPY WITH ENDOBRONCHIAL ULTRASOUND;  Surgeon:  Marshell Garfinkel, MD;  Location: [facility];  Service: Pulmonary;  Laterality: N/A;    FAMILY HISTORY:  Family History  Problem Relation Age of Onset  . Stroke Father     SOCIAL HISTORY:  Social History   Social History  . Marital status: Married    Spouse name: N/A  . Number of children: N/A  . Years of education: N/A   Occupational History  . Not on file.   Social History Main Topics  . Smoking status: Former Research scientist (life sciences)  . Smokeless tobacco: Never Used     Comment: QUIT 15 YEARS AGO   . Alcohol use No  . Drug use: No  . Sexual activity: Not Currently   Other Topics Concern  . Not on file   Social History Narrative  . No narrative on file    ALLERGIES: No known allergies  MEDICATIONS:  Current Facility-Administered Medications  Medication Dose Route Frequency Provider Last Rate Last Dose  . 0.9 %  sodium chloride infusion   Intravenous Continuous Ivor Costa, MD 75 mL/hr at 12/13/16 1609    . 0.9 %  sodium chloride infusion   Intravenous Once Ladell Pier, MD      . acetaminophen (TYLENOL) tablet 650 mg  650 mg Oral Q6H PRN Ivor Costa, MD       Or  . acetaminophen (TYLENOL) suppository 650 mg  650 mg Rectal Q6H PRN Ivor Costa, MD      . albuterol (PROVENTIL) (2.5 MG/3ML) 0.083% nebulizer solution 2.5 mg  2.5 mg Nebulization Q4H PRN Ivor Costa, MD      . alteplase (CATHFLO ACTIVASE) injection 2 mg  2 mg Intracatheter Once PRN Ladell Pier, MD      . CARBOplatin (PARAPLATIN) 490 mg in sodium chloride 0.9 % 250 mL chemo infusion  490 mg Intravenous Once Betsy Coder B, MD      . cefTRIAXone (ROCEPHIN) 2 g in dextrose 5 % 50 mL IVPB  2 g Intravenous Q24H Elgergawy, Silver Huguenin, MD   Stopped at 12/16/16 1238  . dexamethasone (DECADRON) 10 mg in sodium chloride 0.9 % 50 mL IVPB  10 mg Intravenous Once Ladell Pier, MD      . dextromethorphan-guaiFENesin [facility] DM) 30-600 MG per 12 hr tablet 1 tablet  1 tablet Oral BID Ivor Costa, MD   1 tablet at 12/16/16 1014  .  docusate sodium (COLACE) capsule 100 mg  100 mg Oral Daily PRN Elgergawy, Silver Huguenin, MD      . enoxaparin (LOVENOX) injection 55 mg  0.5 mg/kg Subcutaneous Q24H Hongalgi, Anand D, MD   55 mg at 12/14/16 1330  . etoposide (VEPESID) 180 mg in sodium chloride 0.9 % 500 mL chemo infusion  80 mg/m2 (Treatment Plan Recorded) Intravenous Once Betsy Coder B, MD      . heparin lock flush 100 unit/mL  500 Units Intracatheter Once PRN Betsy Coder B, MD      . heparin lock flush 100 unit/mL  250 Units Intracatheter Once PRN  Ladell Pier, MD      . Hot Pack 1 packet  1 packet Topical Once PRN Ladell Pier, MD      . iopamidol (ISOVUE-300) 61 % injection           . lactated ringers infusion   Intravenous Continuous Roderic Palau, MD 50 mL/hr at 12/15/16 1001    . lidocaine (XYLOCAINE) 1 % (with pres) injection    PRN Saverio Danker, PA-C   10 mL at 12/12/16 1546  . nystatin (MYCOSTATIN/NYSTOP) topical powder   Topical TID Elgergawy, Silver Huguenin, MD      . Derrill Memo ON 12/18/2016] ondansetron (ZOFRAN) tablet 4 mg  4 mg Oral Q6H PRN Roxan Hockey, MD       Or  . Derrill Memo ON 12/18/2016] ondansetron (ZOFRAN) injection 4 mg  4 mg Intravenous Q6H PRN Emokpae, Courage, MD      . palonosetron (ALOXI) injection 0.25 mg  0.25 mg Intravenous Once Betsy Coder B, MD      . potassium chloride 10 mEq in 100 mL IVPB  10 mEq Intravenous Q1 Hr x 6 Emokpae, Courage, MD      . senna-docusate (Senokot-S) tablet 2 tablet  2 tablet Oral BID Elgergawy, Silver Huguenin, MD      . sodium chloride flush (NS) 0.9 % injection 10 mL  10 mL Intracatheter PRN Betsy Coder B, MD      . sodium chloride flush (NS) 0.9 % injection 3 mL  3 mL Intravenous Q12H Ivor Costa, MD   3 mL at 12/16/16 1015  . sodium chloride flush (NS) 0.9 % injection 3 mL  3 mL Intravenous PRN Ladell Pier, MD      . zolpidem (AMBIEN) tablet 5 mg  5 mg Oral QHS PRN Ivor Costa, MD        REVIEW OF SYSTEMS:  On review of systems, the patient reports that  her breathing is significantly improved since thoracentesis. She does have some hoarseness that she has noted for the past 4-5 days.  She is a former smoker. She denies any chest pain, shortness of breath, increased cough, fevers, chills or night sweats.  She has noted unintended weight loss recently but is unsure of the exact ammount. She denies any bowel or bladder disturbances, and denies abdominal pain, nausea or vomiting. She denies any new musculoskeletal or joint aches or pains. A complete review of systems is obtained and is otherwise negative.    PHYSICAL EXAM:  Wt Readings from Last 3 Encounters:  12/16/16 257 lb (116.6 kg)   Temp Readings from Last 3 Encounters:  12/16/16 98.3 F (36.8 C) (Oral)   BP Readings from Last 3 Encounters:  12/16/16 136/72   Pulse Readings from Last 3 Encounters:  12/16/16 84   Pain Assessment Pain Score: 0-No pain/10  In general this is an ill appearing african [place] female in no acute distress. She is alert and oriented x4 and appropriate throughout the examination. HEENT reveals that the patient is normocephalic, atraumatic. EOMs are intact. PERRLA. Skin is intact without any evidence of gross lesions. Cardiovascular exam reveals a regular rate and rhythm, no clicks rubs or murmurs are auscultated. Chest is clear to auscultation bilaterally with decreased breath sounds on the left. Lymphatic assessment is performed and reveals palpable adenopathy in the left supraclavicular and left axilla.  No cervical adenopathy appreciated. Abdomen is obese, has active bowel sounds in all quadrants and is intact. The abdomen is soft, non tender, non distended. Lower  extremities are negative for pretibial pitting edema, deep calf tenderness, cyanosis or clubbing.  KPS = 70  100 - Normal; no complaints; no evidence of disease. 90   - Able to carry on normal activity; minor signs or symptoms of disease. 80   - Normal activity with effort; some signs or symptoms of  disease. 50   - Cares for self; unable to carry on normal activity or to do active work. 60   - Requires occasional assistance, but is able to care for most of his personal needs. 50   - Requires considerable assistance and frequent medical care. 62   - Disabled; requires special care and assistance. 74   - Severely disabled; hospital admission is indicated although death not imminent. 28   - Very sick; hospital admission necessary; active supportive treatment necessary. 10   - Moribund; fatal processes progressing rapidly. 0     - Dead  Karnofsky DA, Abelmann [place], Craver LS and Burchenal [facility] [phone]) The use of the nitrogen mustards in the palliative treatment of carcinoma: with particular reference to bronchogenic carcinoma Cancer 1 634-56  LABORATORY DATA:  Lab Results  Component Value Date   WBC 7.7 12/14/2016   HGB 12.3 12/14/2016   HCT 39.6 12/14/2016   MCV 84.8 12/14/2016   PLT 403 (H) 12/14/2016   Lab Results  Component Value Date   NA 134 (L) 12/16/2016   K 2.9 (L) 12/16/2016   CL 97 (L) 12/16/2016   CO2 27 12/16/2016   Lab Results  Component Value Date   ALT 14 12/16/2016   AST 26 12/16/2016   ALKPHOS 49 12/16/2016   BILITOT 0.9 12/16/2016     RADIOGRAPHY: Dg Chest 1 View  Result Date: 12/12/2016 CLINICAL DATA:  Thoracentesis. EXAM: CHEST 1 VIEW COMPARISON:  Ultrasound same day. CT chest 12/11/2016. Chest x-ray 12/10/2016. FINDINGS: Interim decrease in left pleural effusion. No pneumothorax post thoracentesis . Prominent persistent opacification of the left hemithorax remains. IMPRESSION: Interim decrease in left-sided pleural effusion. No pneumothorax following left-sided thoracentesis. Electronically Signed   By: Marcello Moores  Register   On: 12/12/2016 16:33   Dg Chest 2 View  Result Date: 12/10/2016 CLINICAL DATA:  Cough and nasal congestion for 1 month. EXAM: CHEST  2 VIEW COMPARISON:  None. FINDINGS: There is complete opacification of the left hemithorax. No right-sided  pneumothorax. The visualized right lung is clear. The cardiomediastinal silhouette is partially obscured by the left-sided opacification but no abnormalities are seen. IMPRESSION: Complete opacification of the left hemithorax. A CT scan could further evaluate if clinically warranted. Electronically Signed   By: Dorise Bullion III M.D   On: 12/10/2016 22:18   Ct Angio Chest Pe W Or Wo Contrast  Result Date: 12/11/2016 CLINICAL DATA:  Follow-up abnormal chest x-ray EXAM: CT ANGIOGRAPHY CHEST WITH CONTRAST TECHNIQUE: Multidetector CT imaging of the chest was performed using the standard protocol during bolus administration of intravenous contrast. Multiplanar CT image reconstructions and MIPs were obtained to evaluate the vascular anatomy. CONTRAST:  80 mL Isovue 370 intravenous COMPARISON:  Radiograph 12/10/2016 FINDINGS: Cardiovascular: Excessive respiratory motion artifact limits the examination. No central embolus is visualized. Tiny hypodensities within sub segmental right lower lobe branch vessels could be due to artifact although tiny emboli are not excluded. Non aneurysmal aorta. Atherosclerosis. No dissection is seen. There is narrowing of the left pulmonary artery and multiple left upper lobe branch vessels due to a large mass in the lung. There is tumor in case min and narrowing of  the left common carotid and subclavian vessels. Central venous obstruction by tumor with multiple chest wall and paraspinal collateral vessels filling the SVC and brachiocephalic vessels. Mediastinum/Nodes: Midline trachea. Right lobe of thyroid within normal limits. Poorly defined left lobe of thyroid. Esophagus unremarkable. Infiltrative soft tissue mass extends into the superior mediastinum/ thoracic inlet and involves the left hilar structures. Tumor or matted adenopathy within the precarinal space. Soft tissue mass in the subcarinal region measuring at least 2.9 cm. Slight narrowed appearance of the left bronchus.  Lungs/Pleura: Large left pleural effusion with mild shift to the right. There is atelectasis in the left lower lobe. Ill-defined soft tissue mass in the left upper lobe, precise measurements difficult due to the presence of adjacent pleural effusion and poorly defined appearance of the mass. Mass measures at least 8.6 cm transverse by 9.1 cm AP by 12.8 cm craniocaudad. Upper Abdomen: Surgical clips in the gallbladder fossa. No acute abnormality is seen. Musculoskeletal: No acute osseous abnormality. Review of the MIP images confirms the above findings. IMPRESSION: 1. Excessive respiratory motion artifact limits the study. No central embolus is visualized. Under filling of subsegmental right lower lobe pulmonary artery branch vessels could be secondary to artifact although small emboli are difficult to exclude. 2. Large left pleural effusion. Large left upper lobe infiltrative lung mass extending into the left apex. There is mediastinal and hilar invasion by the mass. Vascular encasement of the left pulmonary artery and upper lobe arterial branches by tumor. Tumor encasement of the great vessels of the aortic arch, primarily the left common carotid and subclavian vessels. 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 3. Soft tissue density within the left hilus and subcarinal region could relate to additional tumor or matted adenopathy. Aortic Atherosclerosis (ICD10-I70.0). Electronically Signed   By: Donavan Foil M.D.   On: 12/11/2016 02:07   Nm Bone Scan Whole Body  Result Date: 12/16/2016 CLINICAL DATA:  71 year old female with recently diagnosed large left lung mass with mediastinal involvement. Generalized pain. EXAM: NUCLEAR MEDICINE WHOLE BODY BONE SCAN TECHNIQUE: Whole body anterior and posterior images were obtained approximately 3 hours after intravenous injection of radiopharmaceutical.  RADIOPHARMACEUTICALS:  21.7 mCi Technetium-52m MDP IV COMPARISON:  CTA chest 12/11/2016 FINDINGS: Infiltration of the patient left upper extremity IV such that a moderate amount of the radiotracer dose deposited in the subcutaneous soft tissues about the left shoulder. Surrounding small nodular areas of radiotracer activity likely represent lymphatic uptake, and this might also explain several small foci of activity projecting over the posterior left ribs. Despite the infiltration there is good radiotracer activity in the axial skeleton. There is visible radiotracer activity in both kidneys and the urinary bladder. Homogeneous radiotracer activity throughout the axial skeleton, including the skull, aside from the small posterior left rib foci stated above. There is less adequate appendicular skeleton radiotracer activity, although activity at both shoulders, in the proximal right humerus, and about both hips appears normal. Heterogeneous increased activity at both knees appears degenerative in nature. IMPRESSION: 1. Study complicated by a infiltration of the radiotracer dose into the left upper arm. And this in turn may be related to abnormal extremity venous pressure due to central venous instruction by the left chest tumor. 2. No definite findings specific for metastatic disease to bone. Electronically Signed   By: Genevie Ann M.D.   On: 12/16/2016 15:10   Dg Chest Port 1 View  Result Date: 12/15/2016  CLINICAL DATA:  Status post bronchoscopy. The patient is experiencing coughing and shortness of breath. EXAM: PORTABLE CHEST 1 VIEW COMPARISON:  Chest x-ray of December 12, 2016 FINDINGS: There is complete opacification of the left hemithorax today. There is no significant mediastinal shift. The right lung is clear. The right heart border is normal and the pulmonary vascularity is not engorged. There is degenerative change of both shoulders. IMPRESSION: Complete opacification of the left hemithorax compatible with total  lung atelectasis and/or pleural fluid. There is no significant mediastinal shift. Electronically Signed   By: David  Martinique M.D.   On: 12/15/2016 13:36   Ir Thoracentesis Asp Pleural Space W/img Guide  Result Date: 12/12/2016 INDICATION: New left-sided pleural effusion with possible left lung mass. Request is made for diagnostic and therapeutic thoracentesis. EXAM: ULTRASOUND GUIDED DIAGNOSTIC AND THERAPEUTIC THORACENTESIS MEDICATIONS: 1% lidocaine COMPLICATIONS: None immediate. PROCEDURE: An ultrasound guided thoracentesis was thoroughly discussed with the patient and questions answered. The benefits, risks, alternatives and complications were also discussed. The patient understands and wishes to proceed with the procedure. Written consent was obtained. Ultrasound was performed to localize and mark an adequate pocket of fluid in the left chest. The area was then prepped and draped in the normal sterile fashion. 1% Lidocaine was used for local anesthesia. Under ultrasound guidance a Safe-T-Centesis catheter was introduced. Thoracentesis was performed. The catheter was removed and a dressing applied. FINDINGS: A total of approximately 2 L of serosanguineous fluid was removed. Samples were sent to the laboratory as requested by the clinical team. IMPRESSION: Successful ultrasound guided left thoracentesis yielding 2 L of pleural fluid. Read by: Saverio Danker, PA-C Electronically Signed   By: Marybelle Killings M.D.   On: 12/12/2016 16:46      IMPRESSION/PLAN: 1. 71 y.o. female with newly diagnosed small cell lung cancer.  Today, I talked to the patient about the findings and workup thus far. Dr. Tammi Klippel and I have thoroughly reviewed her case and imaging studies to date.  We discussed the natural history of small cell carcinoma and general treatment, highlighting the role of radiotherapy in the management. We discussed the available radiation techniques, and focused on the details of logistics and delivery. We  reviewed the anticipated acute and late sequelae associated with radiation in this setting. Due to the large pleural effusion masking her underlying disease, radiotherapy is not recommended at this time as there is no clear treatment target visible. The plan will be to start systemic chemotherapy and then reassess her response to therapy to determine if radiotherapy is indicated at that time.  The patient was encouraged to ask questions that were answered to her satisfaction.  We plan to continue to follow her progress via correspondence with medical oncology and are happy to participate in her care in the future if medically indicated.   Nicholos Johns, PA-C

## 2016-12-16 NOTE — Progress Notes (Signed)
Chemotherapy consent signed at 11:15 am today, 12-16-16

## 2016-12-16 NOTE — Progress Notes (Signed)
Chemotherapy infusion delayed due  To patient had to go for bone scan and had a 3 hrs wait for dye, then patient lost her iv line due to infiltration.Patient has very poor vein access, After  peripheral line was placed patient had to go back for CT scan ,when patient came back IV nurse came to insert PICC line.

## 2016-12-16 NOTE — Progress Notes (Signed)
PT Cancellation Note  Patient Details Name: Amanda Macias MRN: [id number] DOB: February 05, 1946   Cancelled Treatment:    Reason Eval/Treat Not Completed: Patient at procedure or test/unavailable   Oni Dietzman,KATHrine E 12/16/2016, 2:07 PM Carmelia Bake, PT, DPT 12/16/2016 Pager: [phone]

## 2016-12-16 NOTE — Progress Notes (Signed)
Peripherally Inserted Central Catheter/Midline Placement  The IV Nurse has discussed with the patient and/or persons authorized to consent for the patient, the purpose of this procedure and the potential benefits and risks involved with this procedure.  The benefits include less needle sticks, lab draws from the catheter, and the patient may be discharged home with the catheter. Risks include, but not limited to, infection, bleeding, blood clot (thrombus formation), and puncture of an artery; nerve damage and irregular heartbeat and possibility to perform a PICC exchange if needed/ordered by physician.  Alternatives to this procedure were also discussed.  Bard Power PICC patient education guide, fact sheet on infection prevention and patient information card has been provided to patient /or left at bedside.    PICC/Midline Placement Documentation  PICC Single Lumen 12/16/16 PICC Right Brachial 44 cm (Active)  Indication for Insertion or Continuance of Line Poor Vasculature-patient has had multiple peripheral attempts or PIVs lasting less than 24 hours 12/16/2016  6:00 PM  Exposed Catheter (cm) 1 cm 12/16/2016  6:00 PM  Dressing Change Due 12/23/16 12/16/2016  6:00 PM       Jule Economy Horton 12/16/2016, 6:23 PM

## 2016-12-16 NOTE — Progress Notes (Signed)
IP PROGRESS NOTE  Subjective:   She reports improved dyspnea after the thoracentesis. No new complaint.  Objective: Vital signs in last 24 hours: Blood pressure 136/73, pulse 82, temperature 98.6 F (37 C), temperature source Oral, resp. rate 18, height 5\' 5"  (1.651 m), weight 257 lb (116.6 kg), SpO2 97 %.  Intake/Output from previous day: 06/21 0701 - 06/22 0700 In: 1825.8 [I.V.:1775.8; IV Piggyback:50] Out: 5 [Blood:5]  Physical Exam: Lungs: Decreased breath sounds throughout the left chest, no respiratory distress Cardiac: Regular rate and rhythm Abdomen: Soft and nontender Extremities: No leg edema, trace edema of the left lower arm    Lab Results:  Recent Labs  12/14/16 0547  WBC 7.7  HGB 12.3  HCT 39.6  PLT 403*    BMET  Recent Labs  12/14/16 0547  NA 139  K 3.5  CL 104  CO2 21*  GLUCOSE 81  BUN 5*  CREATININE 0.63  CALCIUM 9.0    Studies/Results: Dg Chest Port 1 View  Result Date: 12/15/2016 CLINICAL DATA:  Status post bronchoscopy. The patient is experiencing coughing and shortness of breath. EXAM: PORTABLE CHEST 1 VIEW COMPARISON:  Chest x-ray of December 12, 2016 FINDINGS: There is complete opacification of the left hemithorax today. There is no significant mediastinal shift. The right lung is clear. The right heart border is normal and the pulmonary vascularity is not engorged. There is degenerative change of both shoulders. IMPRESSION: Complete opacification of the left hemithorax compatible with total lung atelectasis and/or pleural fluid. There is no significant mediastinal shift. Electronically Signed   By: David  Martinique M.D.   On: 12/15/2016 13:36    Medications: I have reviewed the patient's current medications.  Assessment/Plan:  1. Small cell lung cancer-Left lung/mediastinal mass with a large left pleural effusion  -negative pleural fluid cytology 12/12/2016  Bronchoscopy/EBUS on 12/15/2016-extrinsic compression of the left mainstem  bronchus with distal endobronchial tumor, status post endobronchial and level 7 node biopsies-pathology consistent with small cell lung cancer  Left neck and left axillary lymph nodes on physical exam 12/15/2016  2. 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 Dyspnea/cough secondary to #1  4. Escherichia coli urinary tract infection and Enterobacter bacteremia/bacillus species on blood cultures 12/10/2016, no evidence of ongoing infection, source for bacteremia unclear  Ms. Niesen appears stable. I discussed the bronchoscopy pathology results with Dr. Saralyn Pilar. He reports malignant cells are present with the appearance of small cell lung cancer.  A staging bone scan, CT abdomen, and brain imaging are pending. I recommend proceeding with systemic chemotherapy.  The plan is to begin etoposide/carboplatin chemotherapy today. I discussed the potential side effects associated with this regimen including the chance for nausea/vomiting, mucositis, diarrhea, alopecia, and hematologic toxicity. We discussed the chance of an allergic reaction. She agrees to proceed. She will receive chemotherapy teaching on the inpatient oncology unit.  Recommendations: 1. Proceed with cycle 1 etoposide/carboplatin today 2. Staging CT abdomen and bone scan 3. Oncology will see her over the weekend and I will check on her 12/19/2016 4. We will arrange for outpatient Neulasta at discharge.    LOS: 5 days   Amanda Romberg, MD   12/16/2016, 9:54 AM

## 2016-12-16 NOTE — Progress Notes (Addendum)
Patient Demographics:    Janey Petron, is a 71 y.o. female, DOB - 1946-05-30, OVF:[id number]  Admit date - 12/10/2016   Admitting Physician Ivor Costa, MD  Outpatient Primary MD for the patient is System, Pcp Not In  LOS - 5   Chief Complaint  Patient presents with  . Cough  . Nasal Congestion        Subjective:    Kayliee Atienza today has no fevers, no emesis,  No chest pain,  Shortness of breath is much better, no chest pain   Assessment  & Plan :    Principal Problem:   Mass of left lung Active Problems:   Cough   Hypokalemia   Sepsis (HCC)   UTI (urinary tract infection)   Pleural effusion   Small cell lung cancer [facility])   Brief Narrative:  71 year old female Reformed smoker presented with nonproductive cough, dyspnea, generalized weakness and weight loss. CT chest showed a large left lung mass with invasion of the blood vessels, mediastinal lymph nodes and a large pleural effusion. PCCM consulted and recommended IR for thoracentesis (done) and if cytology negative then consider FOB. 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 She was admitted 12/11/2016 to [facility], transferred to [facility] long hospital on 12/15/2016   Plan:- 1)Small cell lung cancer-Left lung/mediastinal mass with a large left pleural effusion -negative pleural fluid cytology of Thoracentesis on 12/12/2016, Bronchoscopy/EBUSon 12/15/2016-extrinsic compression of the left mainstem bronchus with distal endobronchial tumor, status post endobronchial and level 7 node biopsies-pathology consistent with small cell lung cancer. D/w Dr Benay Spice on 12/16/16, starting [place] on 12/16/16, Dr Benay Spice will Reevaluate her on Monday 12/19/16.   2)ID- urine culture from 12/10/2016 with Escherichia coli, blood cultures from 12/11/2014 with bacillus  species Pleural effusion looks exudative,  Gram stain negative so far,  treated with IV cefepime, okay to continue IV Rocephin for another 3 days  3)FEN- Hypokalemia hypomagnesemia-replace and recheck  Code Status : Full  Disposition Plan  : TBD  Consults  :  Oncology/IR/PCCM  DVT Prophylaxis  :  Lovenox   Lab Results  Component Value Date   PLT 403 (H) 12/14/2016    Inpatient Medications  Scheduled Meds: . CARBOplatin  490 mg Intravenous Once  . dextromethorphan-guaiFENesin  1 tablet Oral BID  . enoxaparin (LOVENOX) injection  0.5 mg/kg Subcutaneous Q24H  . etoposide  80 mg/m2 (Treatment Plan Recorded) Intravenous Once  . iopamidol      . nystatin   Topical TID  . palonosetron  0.25 mg Intravenous Once  . senna-docusate  2 tablet Oral BID  . sodium chloride flush  3 mL Intravenous Q12H   Continuous Infusions: . sodium chloride 75 mL/hr at 12/13/16 1609  . sodium chloride    . cefTRIAXone (ROCEPHIN)  IV Stopped (12/16/16 1238)  . dexamethasone (DECADRON) IVPB [facility]    . lactated ringers 50 mL/hr at 12/15/16 1001  . potassium chloride     PRN Meds:.acetaminophen **OR** acetaminophen, albuterol, alteplase, docusate sodium, heparin lock flush, heparin lock flush, Hot Pack, lidocaine, [START ON 12/18/2016] ondansetron **OR** [START ON 12/18/2016] ondansetron (ZOFRAN) IV, sodium chloride flush, sodium chloride flush, zolpidem    Anti-infectives    Start  Dose/Rate Route Frequency Ordered Stop   12/16/16 1515  cefTRIAXone (ROCEPHIN) 1 g in dextrose 5 % 50 mL IVPB  Status:  Discontinued     1 g 100 mL/hr over 30 Minutes Intravenous Every 24 hours 12/16/16 1508 12/16/16 1510   12/15/16 0900  cefTRIAXone (ROCEPHIN) 2 g in dextrose 5 % 50 mL IVPB     2 g 100 mL/hr over 30 Minutes Intravenous Every 24 hours 12/15/16 0822     12/12/16 0200  azithromycin (ZITHROMAX) 500 mg in dextrose 5 % 250 mL IVPB  Status:  Discontinued     500 mg 250 mL/hr over 60 Minutes Intravenous Every  24 hours 12/11/16 0254 12/13/16 1350   12/11/16 2200  cefTRIAXone (ROCEPHIN) 1 g in dextrose 5 % 50 mL IVPB  Status:  Discontinued     1 g 100 mL/hr over 30 Minutes Intravenous Daily at bedtime 12/11/16 0246 12/11/16 1445   12/11/16 1500  ceFEPIme (MAXIPIME) 1 g in dextrose 5 % 50 mL IVPB  Status:  Discontinued     1 g 100 mL/hr over 30 Minutes Intravenous Every 8 hours 12/11/16 1445 12/15/16 0822   12/11/16 0015  cefTRIAXone (ROCEPHIN) 1 g in dextrose 5 % 50 mL IVPB     1 g 100 mL/hr over 30 Minutes Intravenous  Once 12/11/16 0000 12/11/16 0140   12/11/16 0015  azithromycin (ZITHROMAX) 500 mg in dextrose 5 % 250 mL IVPB     500 mg 250 mL/hr over 60 Minutes Intravenous  Once 12/11/16 0000 12/11/16 0339        Objective:   Vitals:   12/15/16 1530 12/15/16 1545 12/15/16 2208 12/16/16 0540  BP: 130/66 123/77 (!) 153/92 136/73  Pulse: 83 93 90 82  Resp:   18 18  Temp:   98.3 F (36.8 C) 98.6 F (37 C)  TempSrc:   Oral Oral  SpO2:  100% 100% 97%  Weight:    116.6 kg (257 lb)  Height:    5\' 5"  (1.651 m)    Wt Readings from Last 3 Encounters:  12/16/16 116.6 kg (257 lb)     Intake/Output Summary (Last 24 hours) at 12/16/16 1512 Last data filed at 12/15/16 1840  Gross per 24 hour  Intake          1125.83 ml  Output                0 ml  Net          1125.83 ml     Physical Exam  Gen:- Awake Alert,  In no apparent distress  HEENT:- [place].AT, No sclera icterus Neck-Supple Neck,No JVD,.  Lungs-  diminished on left, no significant adventitious sounds CV- S1, S2 normal Abd-  +ve B.Sounds, Abd Soft, No tenderness,    Extremity/Skin:- No  edema,       Data Review:   Micro Results Recent Results (from the past 240 hour(s))  Urine Culture     Status: Abnormal   Collection Time: 12/10/16 10:08 PM  Result Value Ref Range Status   Specimen Description URINE, RANDOM  Final   Special Requests NONE  Final   Culture >=100,000 COLONIES/mL ESCHERICHIA COLI (A)  Final   Report  Status 12/13/2016 FINAL  Final   Organism ID, Bacteria ESCHERICHIA COLI (A)  Final      Susceptibility   Escherichia coli - MIC*    AMPICILLIN >=32 RESISTANT Resistant     CEFAZOLIN <=4 SENSITIVE Sensitive     CEFTRIAXONE <=  1 SENSITIVE Sensitive     CIPROFLOXACIN <=0.25 SENSITIVE Sensitive     GENTAMICIN <=1 SENSITIVE Sensitive     IMIPENEM <=0.25 SENSITIVE Sensitive     NITROFURANTOIN <=16 SENSITIVE Sensitive     TRIMETH/SULFA <=20 SENSITIVE Sensitive     AMPICILLIN/SULBACTAM 16 INTERMEDIATE Intermediate     PIP/TAZO <=4 SENSITIVE Sensitive     Extended ESBL NEGATIVE Sensitive     * >=100,000 COLONIES/mL ESCHERICHIA COLI  Blood culture (routine x 2)     Status: Abnormal   Collection Time: 12/10/16 10:15 PM  Result Value Ref Range Status   Specimen Description BLOOD RIGHT ARM  Final   Special Requests   Final    BOTTLES DRAWN AEROBIC AND ANAEROBIC Blood Culture adequate volume   Culture  Setup Time   Final    GRAM NEGATIVE RODS IN BOTH AEROBIC AND ANAEROBIC BOTTLES CRITICAL RESULT CALLED TO, READ BACK BY AND VERIFIED WITH: Ailene Rud [phone] MLM    Culture ENTEROBACTER CLOACAE (A)  Final   Report Status 12/13/2016 FINAL  Final   Organism ID, Bacteria ENTEROBACTER CLOACAE  Final      Susceptibility   Enterobacter cloacae - MIC*    CEFAZOLIN >=64 RESISTANT Resistant     CEFEPIME <=1 SENSITIVE Sensitive     CEFTAZIDIME <=1 SENSITIVE Sensitive     CEFTRIAXONE <=1 SENSITIVE Sensitive     CIPROFLOXACIN <=0.25 SENSITIVE Sensitive     GENTAMICIN <=1 SENSITIVE Sensitive     IMIPENEM <=0.25 SENSITIVE Sensitive     TRIMETH/SULFA <=20 SENSITIVE Sensitive     PIP/TAZO <=4 SENSITIVE Sensitive     * ENTEROBACTER CLOACAE  Blood Culture ID Panel (Reflexed)     Status: Abnormal   Collection Time: 12/10/16 10:15 PM  Result Value Ref Range Status   Enterococcus species NOT DETECTED NOT DETECTED Final   Listeria monocytogenes NOT DETECTED NOT DETECTED Final   Staphylococcus species  NOT DETECTED NOT DETECTED Final   Staphylococcus aureus NOT DETECTED NOT DETECTED Final   Streptococcus species NOT DETECTED NOT DETECTED Final   Streptococcus agalactiae NOT DETECTED NOT DETECTED Final   Streptococcus pneumoniae NOT DETECTED NOT DETECTED Final   Streptococcus pyogenes NOT DETECTED NOT DETECTED Final   Acinetobacter baumannii NOT DETECTED NOT DETECTED Final   Enterobacteriaceae species DETECTED (A) NOT DETECTED Final    Comment: Enterobacteriaceae represent a large family of gram-negative bacteria, not a single organism. CRITICAL RESULT CALLED TO, READ BACK BY AND VERIFIED WITH: PHARMD M [place] [phone] MLM    Enterobacter cloacae complex DETECTED (A) NOT DETECTED Final    Comment: CRITICAL RESULT CALLED TO, READ BACK BY AND VERIFIED WITH: PHARMD M MACCIA [id number] MLM    Escherichia coli NOT DETECTED NOT DETECTED Final   Klebsiella oxytoca NOT DETECTED NOT DETECTED Final   Klebsiella pneumoniae NOT DETECTED NOT DETECTED Final   Proteus species NOT DETECTED NOT DETECTED Final   Serratia marcescens NOT DETECTED NOT DETECTED Final   Carbapenem resistance NOT DETECTED NOT DETECTED Final   Haemophilus influenzae NOT DETECTED NOT DETECTED Final   Neisseria meningitidis NOT DETECTED NOT DETECTED Final   Pseudomonas aeruginosa NOT DETECTED NOT DETECTED Final   Candida albicans NOT DETECTED NOT DETECTED Final   Candida glabrata NOT DETECTED NOT DETECTED Final   Candida krusei NOT DETECTED NOT DETECTED Final   Candida parapsilosis NOT DETECTED NOT DETECTED Final   Candida tropicalis NOT DETECTED NOT DETECTED Final  Blood culture (routine x 2)     Status: Abnormal  Collection Time: 12/10/16 10:50 PM  Result Value Ref Range Status   Specimen Description BLOOD RIGHT HAND  Final   Special Requests IN PEDIATRIC BOTTLE Blood Culture adequate volume  Final   Culture  Setup Time (A)  Final    GRAM VARIABLE ROD IN PEDIATRIC BOTTLE CRITICAL RESULT CALLED TO, READ BACK BY AND  VERIFIED WITH: Hughie Closs, PHARMD AT 1244 12/11/16 BY  L BENFIELD    Culture (A)  Final    BACILLUS SPECIES Standardized susceptibility testing for this organism is not available.    Report Status 12/13/2016 FINAL  Final  Blood Culture ID Panel (Reflexed)     Status: None   Collection Time: 12/10/16 10:50 PM  Result Value Ref Range Status   Enterococcus species NOT DETECTED NOT DETECTED Final   Listeria monocytogenes NOT DETECTED NOT DETECTED Final   Staphylococcus species NOT DETECTED NOT DETECTED Final   Staphylococcus aureus NOT DETECTED NOT DETECTED Final   Streptococcus species NOT DETECTED NOT DETECTED Final   Streptococcus agalactiae NOT DETECTED NOT DETECTED Final   Streptococcus pneumoniae NOT DETECTED NOT DETECTED Final   Streptococcus pyogenes NOT DETECTED NOT DETECTED Final   Acinetobacter baumannii NOT DETECTED NOT DETECTED Final   Enterobacteriaceae species NOT DETECTED NOT DETECTED Final   Enterobacter cloacae complex NOT DETECTED NOT DETECTED Final   Escherichia coli NOT DETECTED NOT DETECTED Final   Klebsiella oxytoca NOT DETECTED NOT DETECTED Final   Klebsiella pneumoniae NOT DETECTED NOT DETECTED Final   Proteus species NOT DETECTED NOT DETECTED Final   Serratia marcescens NOT DETECTED NOT DETECTED Final   Haemophilus influenzae NOT DETECTED NOT DETECTED Final   Neisseria meningitidis NOT DETECTED NOT DETECTED Final   Pseudomonas aeruginosa NOT DETECTED NOT DETECTED Final   Candida albicans NOT DETECTED NOT DETECTED Final   Candida glabrata NOT DETECTED NOT DETECTED Final   Candida krusei NOT DETECTED NOT DETECTED Final   Candida parapsilosis NOT DETECTED NOT DETECTED Final   Candida tropicalis NOT DETECTED NOT DETECTED Final  Culture, expectorated sputum-assessment     Status: None   Collection Time: 12/11/16  6:14 AM  Result Value Ref Range Status   Specimen Description EXPECTORATED SPUTUM  Final   Special Requests NONE  Final   Sputum evaluation   Final     Sputum specimen not acceptable for testing.  Please recollect.   RESULT CALLED TO, READ BACK BY AND VERIFIED WITH: Madelin Rear RN 11:30 12/14/16 ([email or web address])    Report Status 12/14/2016 FINAL  Final  Gram stain     Status: None   Collection Time: 12/12/16  4:16 PM  Result Value Ref Range Status   Specimen Description PLEURAL LEFT  Final   Special Requests NONE  Final   Gram Stain   Final    FEW WBC PRESENT, PREDOMINANTLY MONONUCLEAR NO ORGANISMS SEEN    Report Status 12/13/2016 FINAL  Final  Culture, body fluid-bottle     Status: None (Preliminary result)   Collection Time: 12/12/16  4:16 PM  Result Value Ref Range Status   Specimen Description PLEURAL LEFT  Final   Special Requests BOTTLES DRAWN AEROBIC AND ANAEROBIC  Final   Culture NO GROWTH 4 DAYS  Final   Report Status PENDING  Incomplete  Culture, blood (Routine X 2) w Reflex to ID Panel     Status: None (Preliminary result)   Collection Time: 12/13/16  4:42 PM  Result Value Ref Range Status   Specimen Description BLOOD LEFT HAND  Final   Special  Requests IN PEDIATRIC BOTTLE Blood Culture adequate volume  Final   Culture NO GROWTH 3 DAYS  Final   Report Status PENDING  Incomplete  Culture, blood (Routine X 2) w Reflex to ID Panel     Status: None (Preliminary result)   Collection Time: 12/13/16  4:47 PM  Result Value Ref Range Status   Specimen Description BLOOD LEFT HAND  Final   Special Requests IN PEDIATRIC BOTTLE Blood Culture adequate volume  Final   Culture NO GROWTH 3 DAYS  Final   Report Status PENDING  Incomplete  Surgical pcr screen     Status: None   Collection Time: 12/14/16  7:00 PM  Result Value Ref Range Status   MRSA, PCR NEGATIVE NEGATIVE Final   Staphylococcus aureus NEGATIVE NEGATIVE Final    Comment:        The Xpert SA Assay (FDA approved for NASAL specimens in patients over 7 years of age), is one component of a comprehensive surveillance program.  Test performance has been validated by [facility]  for patients greater than or equal to 29 year old. It is not intended to diagnose infection nor to guide or monitor treatment.   Culture, expectorated sputum-assessment     Status: None   Collection Time: 12/15/16  2:23 AM  Result Value Ref Range Status   Specimen Description Expect. Sput  Final   Special Requests NONE  Final   Sputum evaluation   Final    Sputum specimen not acceptable for testing.  Please recollect.   Gram Stain Report Called to,Read Back By and Verified With: S. WARD RN, AT [phone] 12/15/16 BY Rush Landmark    Report Status 12/15/2016 FINAL  Final    Radiology Reports Dg Chest 1 View  Result Date: 12/12/2016 CLINICAL DATA:  Thoracentesis. EXAM: CHEST 1 VIEW COMPARISON:  Ultrasound same day. CT chest 12/11/2016. Chest x-ray 12/10/2016. FINDINGS: Interim decrease in left pleural effusion. No pneumothorax post thoracentesis . Prominent persistent opacification of the left hemithorax remains. IMPRESSION: Interim decrease in left-sided pleural effusion. No pneumothorax following left-sided thoracentesis. Electronically Signed   By: Marcello Moores  Register   On: 12/12/2016 16:33   Dg Chest 2 View  Result Date: 12/10/2016 CLINICAL DATA:  Cough and nasal congestion for 1 month. EXAM: CHEST  2 VIEW COMPARISON:  None. FINDINGS: There is complete opacification of the left hemithorax. No right-sided pneumothorax. The visualized right lung is clear. The cardiomediastinal silhouette is partially obscured by the left-sided opacification but no abnormalities are seen. IMPRESSION: Complete opacification of the left hemithorax. A CT scan could further evaluate if clinically warranted. Electronically Signed   By: Dorise Bullion III M.D   On: 12/10/2016 22:18   Ct Angio Chest Pe W Or Wo Contrast  Result Date: 12/11/2016 CLINICAL DATA:  Follow-up abnormal chest x-ray EXAM: CT ANGIOGRAPHY CHEST WITH CONTRAST TECHNIQUE: Multidetector CT imaging of the chest was performed using the standard protocol  during bolus administration of intravenous contrast. Multiplanar CT image reconstructions and MIPs were obtained to evaluate the vascular anatomy. CONTRAST:  80 mL Isovue 370 intravenous COMPARISON:  Radiograph 12/10/2016 FINDINGS: Cardiovascular: Excessive respiratory motion artifact limits the examination. No central embolus is visualized. Tiny hypodensities within sub segmental right lower lobe branch vessels could be due to artifact although tiny emboli are not excluded. Non aneurysmal aorta. Atherosclerosis. No dissection is seen. There is narrowing of the left pulmonary artery and multiple left upper lobe branch vessels due to a large mass in the lung. There is  tumor in case min and narrowing of the left common carotid and subclavian vessels. Central venous obstruction by tumor with multiple chest wall and paraspinal collateral vessels filling the SVC and brachiocephalic vessels. Mediastinum/Nodes: Midline trachea. Right lobe of thyroid within normal limits. Poorly defined left lobe of thyroid. Esophagus unremarkable. Infiltrative soft tissue mass extends into the superior mediastinum/ thoracic inlet and involves the left hilar structures. Tumor or matted adenopathy within the precarinal space. Soft tissue mass in the subcarinal region measuring at least 2.9 cm. Slight narrowed appearance of the left bronchus. Lungs/Pleura: Large left pleural effusion with mild shift to the right. There is atelectasis in the left lower lobe. Ill-defined soft tissue mass in the left upper lobe, precise measurements difficult due to the presence of adjacent pleural effusion and poorly defined appearance of the mass. Mass measures at least 8.6 cm transverse by 9.1 cm AP by 12.8 cm craniocaudad. Upper Abdomen: Surgical clips in the gallbladder fossa. No acute abnormality is seen. Musculoskeletal: No acute osseous abnormality. Review of the MIP images confirms the above findings. IMPRESSION: 1. Excessive respiratory motion artifact  limits the study. No central embolus is visualized. Under filling of subsegmental right lower lobe pulmonary artery branch vessels could be secondary to artifact although small emboli are difficult to exclude. 2. Large left pleural effusion. Large left upper lobe infiltrative lung mass extending into the left apex. There is mediastinal and hilar invasion by the mass. Vascular encasement of the left pulmonary artery and upper lobe arterial branches by tumor. Tumor encasement of the great vessels of the aortic arch, primarily the left common carotid and subclavian vessels. 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 3. Soft tissue density within the left hilus and subcarinal region could relate to additional tumor or matted adenopathy. Aortic Atherosclerosis (ICD10-I70.0). Electronically Signed   By: Donavan Foil M.D.   On: 12/11/2016 02:07   Dg Chest Port 1 View  Result Date: 12/15/2016 CLINICAL DATA:  Status post bronchoscopy. The patient is experiencing coughing and shortness of breath. EXAM: PORTABLE CHEST 1 VIEW COMPARISON:  Chest x-ray of December 12, 2016 FINDINGS: There is complete opacification of the left hemithorax today. There is no significant mediastinal shift. The right lung is clear. The right heart border is normal and the pulmonary vascularity is not engorged. There is degenerative change of both shoulders. IMPRESSION: Complete opacification of the left hemithorax compatible with total lung atelectasis and/or pleural fluid. There is no significant mediastinal shift. Electronically Signed   By: David  Martinique M.D.   On: 12/15/2016 13:36   Ir Thoracentesis Asp Pleural Space W/img Guide  Result Date: 12/12/2016 INDICATION: New left-sided pleural effusion with possible left lung mass. Request is made for diagnostic and therapeutic thoracentesis. EXAM: ULTRASOUND GUIDED DIAGNOSTIC AND THERAPEUTIC  THORACENTESIS MEDICATIONS: 1% lidocaine COMPLICATIONS: None immediate. PROCEDURE: An ultrasound guided thoracentesis was thoroughly discussed with the patient and questions answered. The benefits, risks, alternatives and complications were also discussed. The patient understands and wishes to proceed with the procedure. Written consent was obtained. Ultrasound was performed to localize and mark an adequate pocket of fluid in the left chest. The area was then prepped and draped in the normal sterile fashion. 1% Lidocaine was used for local anesthesia. Under ultrasound guidance a Safe-T-Centesis catheter was introduced. Thoracentesis was performed. The catheter was removed and a dressing applied. FINDINGS: A total of approximately 2 L of serosanguineous fluid was removed. Samples  were sent to the laboratory as requested by the clinical team. IMPRESSION: Successful ultrasound guided left thoracentesis yielding 2 L of pleural fluid. Read by: Saverio Danker, PA-C Electronically Signed   By: Marybelle Killings M.D.   On: 12/12/2016 16:46     CBC  Recent Labs Lab 12/10/16 2214 12/11/16 0307 12/12/16 0558 12/13/16 0526 12/14/16 0547  WBC 12.4* 10.6* 10.2 8.0 7.7  HGB 13.0 12.0 11.7* 12.0 12.3  HCT 41.3 37.6 37.5 38.1 39.6  PLT 451* 385 412* 387 403*  MCV 84.8 83.2 83.7 83.7 84.8  MCH 26.7 26.5 26.1 26.4 26.3  MCHC 31.5 31.9 31.2 31.5 31.1  RDW 15.0 14.9 15.0 15.2 15.6*  LYMPHSABS 1.1  --   --   --   --   MONOABS 0.7  --   --   --   --   EOSABS 0.1  --   --   --   --   BASOSABS 0.0  --   --   --   --     Chemistries   Recent Labs Lab 12/10/16 2214 12/11/16 0307 12/12/16 0558 12/13/16 0526 12/14/16 0547 12/16/16 1141  NA 135 132* 137 138 139 134*  K 2.8* 2.8* 2.9* 3.4* 3.5 2.9*  CL 95* 94* 100* 102 104 97*  CO2 23 22 26 26  21* 27  GLUCOSE 102* 308* 87 90 81 87  BUN 10 8 7 6  5* 7  CREATININE 1.06* 0.88 0.70 0.67 0.63 0.58  CALCIUM 9.4 8.5* 9.0 9.0 9.0 8.7*  MG  --  1.3* 1.6* 2.1  --  1.6*    AST 37 29  --   --   --  26  ALT 14 12*  --   --   --  14  ALKPHOS 66 54  --   --   --  49  BILITOT 1.8* 1.5*  --   --   --  0.9   ------------------------------------------------------------------------------------------------------------------ No results for input(s): CHOL, HDL, LDLCALC, TRIG, CHOLHDL, LDLDIRECT in the last 72 hours.  No results found for: HGBA1C ------------------------------------------------------------------------------------------------------------------ No results for input(s): TSH, T4TOTAL, T3FREE, THYROIDAB in the last 72 hours.  Invalid input(s): FREET3 ------------------------------------------------------------------------------------------------------------------ No results for input(s): VITAMINB12, FOLATE, FERRITIN, TIBC, IRON, RETICCTPCT in the last 72 hours.  Coagulation profile  Recent Labs Lab 12/11/16 0307  INR 1.33    No results for input(s): DDIMER in the last 72 hours.  Cardiac Enzymes  Recent Labs Lab 12/10/16 2214  TROPONINI <0.03   ------------------------------------------------------------------------------------------------------------------    Component Value Date/Time   BNP 41.4 12/10/2016 2214     Kolbie Lepkowski M.D on 12/16/2016 at 3:12 PM  Between 7am to 7pm - Pager - [phone]  After 7pm go to [email or web address] - password [email or web address]  [facility] -  Office  [phone]   Voice Recognition Viviann Spare dictation system was used to create this note, attempts have been made to correct errors. Please contact the author with questions and/or clarifications.

## 2016-12-17 DIAGNOSIS — R7881 Bacteremia: Secondary | ICD-10-CM

## 2016-12-17 DIAGNOSIS — A4151 Sepsis due to Escherichia coli [E. coli]: Secondary | ICD-10-CM

## 2016-12-17 DIAGNOSIS — J918 Pleural effusion in other conditions classified elsewhere: Secondary | ICD-10-CM

## 2016-12-17 DIAGNOSIS — B9689 Other specified bacterial agents as the cause of diseases classified elsewhere: Secondary | ICD-10-CM

## 2016-12-17 DIAGNOSIS — B9620 Unspecified Escherichia coli [E. coli] as the cause of diseases classified elsewhere: Secondary | ICD-10-CM

## 2016-12-17 DIAGNOSIS — Z5111 Encounter for antineoplastic chemotherapy: Secondary | ICD-10-CM

## 2016-12-17 LAB — COMPREHENSIVE METABOLIC PANEL
ALK PHOS: 52 U/L (ref 38–126)
ALT: 16 U/L (ref 14–54)
AST: 27 U/L (ref 15–41)
Albumin: 3.1 g/dL — ABNORMAL LOW (ref 3.5–5.0)
Anion gap: 12 (ref 5–15)
BILIRUBIN TOTAL: 1.2 mg/dL (ref 0.3–1.2)
BUN: 7 mg/dL (ref 6–20)
CO2: 23 mmol/L (ref 22–32)
CREATININE: 0.61 mg/dL (ref 0.44–1.00)
Calcium: 8.7 mg/dL — ABNORMAL LOW (ref 8.9–10.3)
Chloride: 99 mmol/L — ABNORMAL LOW (ref 101–111)
GFR calc Af Amer: >60 mL/min (ref >60)
Glucose, Bld: 132 mg/dL — ABNORMAL HIGH (ref 65–99)
Potassium: 3.9 mmol/L (ref 3.5–5.1)
Sodium: 134 mmol/L — ABNORMAL LOW (ref 135–145)
TOTAL PROTEIN: 6.9 g/dL (ref 6.5–8.1)

## 2016-12-17 LAB — CBC
HCT: 36.9 % (ref 36.0–46.0)
Hemoglobin: 11.7 g/dL — ABNORMAL LOW (ref 12.0–15.0)
MCH: 26.4 pg (ref 26.0–34.0)
MCHC: 31.7 g/dL (ref 30.0–36.0)
MCV: 83.1 fL (ref 78.0–100.0)
PLATELETS: 395 10*3/uL (ref 150–400)
RBC: 4.44 MIL/uL (ref 3.87–5.11)
RDW: 15 % (ref 11.5–15.5)
WBC: 8.9 10*3/uL (ref 4.0–10.5)

## 2016-12-17 LAB — URIC ACID: URIC ACID, SERUM: 6 mg/dL (ref 2.3–6.6)

## 2016-12-17 LAB — CULTURE, BODY FLUID-BOTTLE: CULTURE: NO GROWTH

## 2016-12-17 LAB — MAGNESIUM: Magnesium: 1.6 mg/dL — ABNORMAL LOW (ref 1.7–2.4)

## 2016-12-17 LAB — CULTURE, BODY FLUID W GRAM STAIN -BOTTLE

## 2016-12-17 MED ORDER — HOT PACK MISC ONCOLOGY
1.0000 | Freq: Once | Status: AC | PRN
  Filled 2016-12-17: qty 1

## 2016-12-17 MED ORDER — SODIUM CHLORIDE 0.9 % IV SOLN
10.0000 mg | Freq: Once | INTRAVENOUS | Status: AC
  Administered 2016-12-17: 10 mg via INTRAVENOUS
  Filled 2016-12-17: qty 1

## 2016-12-17 MED ORDER — SODIUM CHLORIDE 0.9% FLUSH: 10.0000 mL | INTRAVENOUS | Status: DC | PRN

## 2016-12-17 MED ORDER — SODIUM CHLORIDE 0.9% FLUSH: 3.0000 mL | INTRAVENOUS | Status: DC | PRN

## 2016-12-17 MED ORDER — SODIUM CHLORIDE 0.9 % IV SOLN
80.0000 mg/m2 | Freq: Once | INTRAVENOUS | Status: AC
  Administered 2016-12-17: 180 mg via INTRAVENOUS
  Filled 2016-12-17: qty 9

## 2016-12-17 MED ORDER — MAGNESIUM SULFATE 2 GM/50ML IV SOLN: 2.0000 g | Freq: Once | INTRAVENOUS | Status: DC

## 2016-12-17 MED ORDER — SODIUM CHLORIDE 0.9 % IV SOLN: Freq: Once | INTRAVENOUS | Status: DC

## 2016-12-17 NOTE — Progress Notes (Signed)
Amanda Macias is feeling a bit better. She tolerated her first cycle of chemotherapy okay. She may have a little bit of nausea. There is no vomiting.  Her appetite is marginal.  She's had no diarrhea. There's been no dysuria. She does have a Escherichia coli urinary tract infection. Also noted is Enterobacter in her blood. She is on Rocephin.  Her labs today show her lites are currently 8.9. Hemoglobin 11.7. Her potassium 3.9. Her albumin is 3.1.  Given that this is small cell lung cancer, we will have to watch out for tumor lysis. I'll check a uric acid level.  Her bowel sounds all look stable. Blood pressure 137/84. Temperature 98.5. Pulse 89. Her lungs do sound slightly congested. Cardiac exam is regular rate and rhythm. Abdomen is obese. Extremities shows no clubbing or cyanosis.  Amanda Macias has small cell lung cancer. I would have her think that this would be extensive stage by the pleural effusion.  She'll have day #2 of cycle #1 of carboplatin/etoposide.  Lattie Haw, MD

## 2016-12-17 NOTE — Progress Notes (Signed)
PROGRESS NOTE  Amanda Macias  HAL:[id number] DOB: [birth year] DOA: 12/10/2016 PCP: System, Pcp Not In   Brief Narrative: Amanda Macias is a 71 y.o. female who presented for nonproductive cough, dyspnea, generalized weakness and weight loss. CT chest showed a large left lung mass with invasion of the blood vessels, mediastinal lymph nodes and a large pleural effusion. PCCM consulted and recommended IR for thoracentesis (done) and if cytology negative then consider FOB. 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 She was admitted 12/11/2016 to [facility], transferred to [facility] on 12/15/2016 and has begun chemotherapy.  Assessment & Plan: Principal Problem:   Mass of left lung Active Problems:   Cough   Hypokalemia   Sepsis (HCC)   UTI (urinary tract infection)   Pleural effusion   Small cell lung cancer ([place])  Small cell lung cancer: Left lung/mediastinum with large left pleural effusion, negative cytology from thoracentesis 6/18. Bronchoscopy/EBUSon 12/15/2016-extrinsic compression of the left mainstem bronchus with distal endobronchial tumor, status post endobronchial and level 7 node biopsies-pathology consistent with small cell lung cancer.  - Began chemotherapy 6/22. - Appreciate oncology recommendations. - checking uric acid given possibility of TLS  E. coli UTI:  - Continue ceftriaxone per sensitivity data  Enterobacter cloacae bacteremia:  - Continue ceftriaxone per sensitivity data  Hypokalemia, hypomagnesemia:  - Repleted, monitor  DVT prophylaxis: Lovenox Code Status: Full Family Communication: None at bedside this AM Disposition Plan: Continue inpatient management  Consultants:   Oncology  IR  PCCM  Procedures:   Thoracentesis 6/18  Antimicrobials:  Azithromycin 6/17 - 6/19  Cefepime 6/17 - 6/21  Ceftriaxone 6/21 -   Subjective: Pt without  complaints. Tolerated chemotherapy, poor appetite without nausea or vomiting.   Objective: Vitals:   12/16/16 2255 12/17/16 0700 12/17/16 1300 12/17/16 1305  BP: (!) 135/95 137/84 129/89   Pulse: 81 89 85   Resp: 20 20 20    Temp: 97.9 F (36.6 C) 98.5 F (36.9 C) 98.7 F (37.1 C)   TempSrc: Oral Oral Oral   SpO2: 93% 92% (!) 87% 92%  Weight:      Height:        Intake/Output Summary (Last 24 hours) at 12/17/16 1809 Last data filed at 12/17/16 1731  Gross per 24 hour  Intake          3038.75 ml  Output                0 ml  Net          3038.75 ml   Filed Weights   12/11/16 0224 12/11/16 0457 12/16/16 0540  Weight: 112.5 kg (248 lb 2 oz) 111.9 kg (246 lb 12.8 oz) 116.6 kg (257 lb)    Examination: General exam: 71 y.o. female in no distress  Respiratory system: Non-labored breathing on supplemental oxygen. Scattered rhonchi.  Cardiovascular system: Regular rate and rhythm. No murmur, rub, or gallop. No JVD, and no pedal edema. Gastrointestinal system: Abdomen soft, non-tender, non-distended, with normoactive bowel sounds. No organomegaly or masses felt. Central nervous system: Alert and oriented. No focal neurological deficits. Extremities: Warm, no deformities Skin: No rashes, lesions no ulcers Psychiatry: Judgement and insight appear normal. Mood & affect appropriate.   Data Reviewed: I have personally reviewed following labs and imaging studies  CBC:  Recent Labs Lab 12/10/16 2214 12/11/16 0307 12/12/16 0558 12/13/16 0526 12/14/16 0547 12/17/16 0548  WBC 12.4* 10.6* 10.2 8.0 7.7  8.9  NEUTROABS 10.4*  --   --   --   --   --   HGB 13.0 12.0 11.7* 12.0 12.3 11.7*  HCT 41.3 37.6 37.5 38.1 39.6 36.9  MCV 84.8 83.2 83.7 83.7 84.8 83.1  PLT 451* 385 412* 387 403* 332   Basic Metabolic Panel:  Recent Labs Lab 12/11/16 0307 12/12/16 0558 12/13/16 0526 12/14/16 0547 12/16/16 1141 12/17/16 0548  NA 132* 137 138 139 134* 134*  K 2.8* 2.9* 3.4* 3.5 2.9* 3.9    CL 94* 100* 102 104 97* 99*  CO2 22 26 26  21* 27 23  GLUCOSE 308* 87 90 81 87 132*  BUN 8 7 6  5* 7 7  CREATININE 0.88 0.70 0.67 0.63 0.58 0.61  CALCIUM 8.5* 9.0 9.0 9.0 8.7* 8.7*  MG 1.3* 1.6* 2.1  --  1.6* 1.6*   GFR: Estimated Creatinine Clearance: 84.7 mL/min (by C-G formula based on SCr of 0.61 mg/dL). Liver Function Tests:  Recent Labs Lab 12/10/16 2214 12/11/16 0307 12/16/16 1141 12/17/16 0548  AST 37 29 26 27   ALT 14 12* 14 16  ALKPHOS 66 54 49 52  BILITOT 1.8* 1.5* 0.9 1.2  PROT 7.7 6.6 6.6 6.9  ALBUMIN 3.3* 2.8* 3.0* 3.1*   No results for input(s): LIPASE, AMYLASE in the last 168 hours. No results for input(s): AMMONIA in the last 168 hours. Coagulation Profile:  Recent Labs Lab 12/11/16 0307  INR 1.33   Cardiac Enzymes:  Recent Labs Lab 12/10/16 2214  TROPONINI <0.03   BNP (last 3 results) No results for input(s): PROBNP in the last 8760 hours. HbA1C: No results for input(s): HGBA1C in the last 72 hours. CBG:  Recent Labs Lab 12/11/16 0818 12/12/16 0808  GLUCAP 113* 92   Lipid Profile: No results for input(s): CHOL, HDL, LDLCALC, TRIG, CHOLHDL, LDLDIRECT in the last 72 hours. Thyroid Function Tests: No results for input(s): TSH, T4TOTAL, FREET4, T3FREE, THYROIDAB in the last 72 hours. Anemia Panel: No results for input(s): VITAMINB12, FOLATE, FERRITIN, TIBC, IRON, RETICCTPCT in the last 72 hours. Urine analysis:    Component Value Date/Time   COLORURINE AMBER (A) 12/10/2016 2208   APPEARANCEUR CLOUDY (A) 12/10/2016 2208   LABSPEC 1.034 (H) 12/10/2016 2208   PHURINE 5.0 12/10/2016 2208   GLUCOSEU NEGATIVE 12/10/2016 2208   HGBUR NEGATIVE 12/10/2016 2208   BILIRUBINUR MODERATE (A) 12/10/2016 2208   KETONESUR 20 (A) 12/10/2016 2208   PROTEINUR 100 (A) 12/10/2016 2208   NITRITE NEGATIVE 12/10/2016 2208   LEUKOCYTESUR SMALL (A) 12/10/2016 2208   Recent Results (from the past 240 hour(s))  Urine Culture     Status: Abnormal    Collection Time: 12/10/16 10:08 PM  Result Value Ref Range Status   Specimen Description URINE, RANDOM  Final   Special Requests NONE  Final   Culture >=100,000 COLONIES/mL ESCHERICHIA COLI (A)  Final   Report Status 12/13/2016 FINAL  Final   Organism ID, Bacteria ESCHERICHIA COLI (A)  Final      Susceptibility   Escherichia coli - MIC*    AMPICILLIN >=32 RESISTANT Resistant     CEFAZOLIN <=4 SENSITIVE Sensitive     CEFTRIAXONE <=1 SENSITIVE Sensitive     CIPROFLOXACIN <=0.25 SENSITIVE Sensitive     GENTAMICIN <=1 SENSITIVE Sensitive     IMIPENEM <=0.25 SENSITIVE Sensitive     NITROFURANTOIN <=16 SENSITIVE Sensitive     TRIMETH/SULFA <=20 SENSITIVE Sensitive     AMPICILLIN/SULBACTAM 16 INTERMEDIATE Intermediate     PIP/TAZO <=  4 SENSITIVE Sensitive     Extended ESBL NEGATIVE Sensitive     * >=100,000 COLONIES/mL ESCHERICHIA COLI  Blood culture (routine x 2)     Status: Abnormal   Collection Time: 12/10/16 10:15 PM  Result Value Ref Range Status   Specimen Description BLOOD RIGHT ARM  Final   Special Requests   Final    BOTTLES DRAWN AEROBIC AND ANAEROBIC Blood Culture adequate volume   Culture  Setup Time   Final    GRAM NEGATIVE RODS IN BOTH AEROBIC AND ANAEROBIC BOTTLES CRITICAL RESULT CALLED TO, READ BACK BY AND VERIFIED WITH: Ailene Rud [id number] [phone] MLM    Culture ENTEROBACTER CLOACAE (A)  Final   Report Status 12/13/2016 FINAL  Final   Organism ID, Bacteria ENTEROBACTER CLOACAE  Final      Susceptibility   Enterobacter cloacae - MIC*    CEFAZOLIN >=64 RESISTANT Resistant     CEFEPIME <=1 SENSITIVE Sensitive     CEFTAZIDIME <=1 SENSITIVE Sensitive     CEFTRIAXONE <=1 SENSITIVE Sensitive     CIPROFLOXACIN <=0.25 SENSITIVE Sensitive     GENTAMICIN <=1 SENSITIVE Sensitive     IMIPENEM <=0.25 SENSITIVE Sensitive     TRIMETH/SULFA <=20 SENSITIVE Sensitive     PIP/TAZO <=4 SENSITIVE Sensitive     * ENTEROBACTER CLOACAE  Blood Culture ID Panel (Reflexed)     Status:  Abnormal   Collection Time: 12/10/16 10:15 PM  Result Value Ref Range Status   Enterococcus species NOT DETECTED NOT DETECTED Final   Listeria monocytogenes NOT DETECTED NOT DETECTED Final   Staphylococcus species NOT DETECTED NOT DETECTED Final   Staphylococcus aureus NOT DETECTED NOT DETECTED Final   Streptococcus species NOT DETECTED NOT DETECTED Final   Streptococcus agalactiae NOT DETECTED NOT DETECTED Final   Streptococcus pneumoniae NOT DETECTED NOT DETECTED Final   Streptococcus pyogenes NOT DETECTED NOT DETECTED Final   Acinetobacter baumannii NOT DETECTED NOT DETECTED Final   Enterobacteriaceae species DETECTED (A) NOT DETECTED Final    Comment: Enterobacteriaceae represent a large family of gram-negative bacteria, not a single organism. CRITICAL RESULT CALLED TO, READ BACK BY AND VERIFIED WITH: PHARMD M Donora [phone] MLM    Enterobacter cloacae complex DETECTED (A) NOT DETECTED Final    Comment: CRITICAL RESULT CALLED TO, READ BACK BY AND VERIFIED WITH: PHARMD M Moran [phone] MLM    Escherichia coli NOT DETECTED NOT DETECTED Final   Klebsiella oxytoca NOT DETECTED NOT DETECTED Final   Klebsiella pneumoniae NOT DETECTED NOT DETECTED Final   Proteus species NOT DETECTED NOT DETECTED Final   Serratia marcescens NOT DETECTED NOT DETECTED Final   Carbapenem resistance NOT DETECTED NOT DETECTED Final   Haemophilus influenzae NOT DETECTED NOT DETECTED Final   Neisseria meningitidis NOT DETECTED NOT DETECTED Final   Pseudomonas aeruginosa NOT DETECTED NOT DETECTED Final   Candida albicans NOT DETECTED NOT DETECTED Final   Candida glabrata NOT DETECTED NOT DETECTED Final   Candida krusei NOT DETECTED NOT DETECTED Final   Candida parapsilosis NOT DETECTED NOT DETECTED Final   Candida tropicalis NOT DETECTED NOT DETECTED Final  Blood culture (routine x 2)     Status: Abnormal   Collection Time: 12/10/16 10:50 PM  Result Value Ref Range Status   Specimen Description  BLOOD RIGHT HAND  Final   Special Requests IN PEDIATRIC BOTTLE Blood Culture adequate volume  Final   Culture  Setup Time (A)  Final    GRAM VARIABLE ROD IN PEDIATRIC BOTTLE CRITICAL RESULT  CALLED TO, READ BACK BY AND VERIFIED WITH: Hughie Closs, PHARMD AT 1244 12/11/16 BY  L BENFIELD    Culture (A)  Final    BACILLUS SPECIES Standardized susceptibility testing for this organism is not available.    Report Status 12/13/2016 FINAL  Final  Blood Culture ID Panel (Reflexed)     Status: None   Collection Time: 12/10/16 10:50 PM  Result Value Ref Range Status   Enterococcus species NOT DETECTED NOT DETECTED Final   Listeria monocytogenes NOT DETECTED NOT DETECTED Final   Staphylococcus species NOT DETECTED NOT DETECTED Final   Staphylococcus aureus NOT DETECTED NOT DETECTED Final   Streptococcus species NOT DETECTED NOT DETECTED Final   Streptococcus agalactiae NOT DETECTED NOT DETECTED Final   Streptococcus pneumoniae NOT DETECTED NOT DETECTED Final   Streptococcus pyogenes NOT DETECTED NOT DETECTED Final   Acinetobacter baumannii NOT DETECTED NOT DETECTED Final   Enterobacteriaceae species NOT DETECTED NOT DETECTED Final   Enterobacter cloacae complex NOT DETECTED NOT DETECTED Final   Escherichia coli NOT DETECTED NOT DETECTED Final   Klebsiella oxytoca NOT DETECTED NOT DETECTED Final   Klebsiella pneumoniae NOT DETECTED NOT DETECTED Final   Proteus species NOT DETECTED NOT DETECTED Final   Serratia marcescens NOT DETECTED NOT DETECTED Final   Haemophilus influenzae NOT DETECTED NOT DETECTED Final   Neisseria meningitidis NOT DETECTED NOT DETECTED Final   Pseudomonas aeruginosa NOT DETECTED NOT DETECTED Final   Candida albicans NOT DETECTED NOT DETECTED Final   Candida glabrata NOT DETECTED NOT DETECTED Final   Candida krusei NOT DETECTED NOT DETECTED Final   Candida parapsilosis NOT DETECTED NOT DETECTED Final   Candida tropicalis NOT DETECTED NOT DETECTED Final  Culture, expectorated  sputum-assessment     Status: None   Collection Time: 12/11/16  6:14 AM  Result Value Ref Range Status   Specimen Description EXPECTORATED SPUTUM  Final   Special Requests NONE  Final   Sputum evaluation   Final    Sputum specimen not acceptable for testing.  Please recollect.   RESULT CALLED TO, READ BACK BY AND VERIFIED WITH: Madelin Rear RN 11:30 12/14/16 ([email or web address])    Report Status 12/14/2016 FINAL  Final  Gram stain     Status: None   Collection Time: 12/12/16  4:16 PM  Result Value Ref Range Status   Specimen Description PLEURAL LEFT  Final   Special Requests NONE  Final   Gram Stain   Final    FEW WBC PRESENT, PREDOMINANTLY MONONUCLEAR NO ORGANISMS SEEN    Report Status 12/13/2016 FINAL  Final  Culture, body fluid-bottle     Status: None   Collection Time: 12/12/16  4:16 PM  Result Value Ref Range Status   Specimen Description PLEURAL LEFT  Final   Special Requests BOTTLES DRAWN AEROBIC AND ANAEROBIC  Final   Culture NO GROWTH 5 DAYS  Final   Report Status 12/17/2016 FINAL  Final  Culture, blood (Routine X 2) w Reflex to ID Panel     Status: None (Preliminary result)   Collection Time: 12/13/16  4:42 PM  Result Value Ref Range Status   Specimen Description BLOOD LEFT HAND  Final   Special Requests IN PEDIATRIC BOTTLE Blood Culture adequate volume  Final   Culture NO GROWTH 4 DAYS  Final   Report Status PENDING  Incomplete  Culture, blood (Routine X 2) w Reflex to ID Panel     Status: None (Preliminary result)   Collection Time: 12/13/16  4:47 PM  Result  Value Ref Range Status   Specimen Description BLOOD LEFT HAND  Final   Special Requests IN PEDIATRIC BOTTLE Blood Culture adequate volume  Final   Culture NO GROWTH 4 DAYS  Final   Report Status PENDING  Incomplete  Surgical pcr screen     Status: None   Collection Time: 12/14/16  7:00 PM  Result Value Ref Range Status   MRSA, PCR NEGATIVE NEGATIVE Final   Staphylococcus aureus NEGATIVE NEGATIVE Final    Comment:          The Xpert SA Assay (FDA approved for NASAL specimens in patients over 52 years of age), is one component of a comprehensive surveillance program.  Test performance has been validated by [facility] for patients greater than or equal to 90 year old. It is not intended to diagnose infection nor to guide or monitor treatment.   Culture, expectorated sputum-assessment     Status: None   Collection Time: 12/15/16  2:23 AM  Result Value Ref Range Status   Specimen Description Expect. Sput  Final   Special Requests NONE  Final   Sputum evaluation   Final    Sputum specimen not acceptable for testing.  Please recollect.   Gram Stain Report Called to,Read Back By and Verified With: S. WARD RN, AT [phone] 12/15/16 BY Rush Landmark    Report Status 12/15/2016 FINAL  Final      Radiology Studies: Nm Bone Scan Whole Body  Result Date: 12/16/2016 CLINICAL DATA:  71 year old female with recently diagnosed large left lung mass with mediastinal involvement. Generalized pain. EXAM: NUCLEAR MEDICINE WHOLE BODY BONE SCAN TECHNIQUE: Whole body anterior and posterior images were obtained approximately 3 hours after intravenous injection of radiopharmaceutical. RADIOPHARMACEUTICALS:  21.7 mCi Technetium-31m MDP IV COMPARISON:  CTA chest 12/11/2016 FINDINGS: Infiltration of the patient left upper extremity IV such that a moderate amount of the radiotracer dose deposited in the subcutaneous soft tissues about the left shoulder. Surrounding small nodular areas of radiotracer activity likely represent lymphatic uptake, and this might also explain several small foci of activity projecting over the posterior left ribs. Despite the infiltration there is good radiotracer activity in the axial skeleton. There is visible radiotracer activity in both kidneys and the urinary bladder. Homogeneous radiotracer activity throughout the axial skeleton, including the skull, aside from the small posterior left rib foci stated above.  There is less adequate appendicular skeleton radiotracer activity, although activity at both shoulders, in the proximal right humerus, and about both hips appears normal. Heterogeneous increased activity at both knees appears degenerative in nature. IMPRESSION: 1. Study complicated by a infiltration of the radiotracer dose into the left upper arm. And this in turn may be related to abnormal extremity venous pressure due to central venous instruction by the left chest tumor. 2. No definite findings specific for metastatic disease to bone. Electronically Signed   By: Genevie Ann M.D.   On: 12/16/2016 15:10   Ct Abdomen Pelvis W Contrast  Result Date: 12/16/2016 CLINICAL DATA:  Small cell carcinoma EXAM: CT ABDOMEN AND PELVIS WITH CONTRAST TECHNIQUE: Multidetector CT imaging of the abdomen and pelvis was performed using the standard protocol following bolus administration of intravenous contrast. CONTRAST:  158mL ISOVUE-300 IOPAMIDOL (ISOVUE-300) INJECTION 61% COMPARISON:  Partial comparison to CT chest dated 12/11/2016 FINDINGS: Lower chest: Unchanged from recent CT chest, noting large left pleural effusion, underlying left lung atelectasis/collapse, and thoracic lymphadenopathy. Hepatobiliary: Postsurgical changes along the gallbladder fossa (series 2/ image 37). Liver is otherwise within  normal limits. Status post cholecystectomy. No intrahepatic or extrahepatic ductal dilatation. Pancreas: Within normal limits. Spleen: Within normal limits. Splenule in the left mid abdomen (series 2/ image 42). Adrenals/Urinary Tract: Adrenal glands are within normal limits. Kidneys are within normal limits.  No hydronephrosis. Bladder is within normal limits. Stomach/Bowel: Stomach is within normal limits. No evidence of bowel obstruction. Normal appendix (series 2/ image 61). Left colonic diverticulosis, without evidence of diverticulitis. Vascular/Lymphatic: No evidence of abdominal aortic aneurysm. Atherosclerotic  calcifications of the abdominal aorta and branch vessels. No suspicious abdominopelvic lymphadenopathy. Reproductive: Uterus is within normal limits. Bilateral ovaries are within normal limits. Other: No abdominopelvic ascites. Musculoskeletal: Degenerative changes of the visualized thoracolumbar spine. IMPRESSION: No evidence of metastatic disease in the abdomen/pelvis. Lower thoracic findings are unchanged from recent CT chest. Electronically Signed   By: Julian Hy M.D.   On: 12/16/2016 17:09    Scheduled Meds: . dextromethorphan-guaiFENesin  1 tablet Oral BID  . enoxaparin (LOVENOX) injection  0.5 mg/kg Subcutaneous Q24H  . nystatin   Topical TID  . senna-docusate  2 tablet Oral BID  . sodium chloride flush  10-40 mL Intracatheter Q12H  . sodium chloride flush  3 mL Intravenous Q12H   Continuous Infusions: . sodium chloride 75 mL/hr at 12/17/16 1731  . cefTRIAXone (ROCEPHIN)  IV Stopped (12/17/16 1018)  . lactated ringers 50 mL/hr at 12/15/16 1001     LOS: 6 days   Time spent: 25 minutes.  Vance Gather, MD [facility] Pager [phone]  If 7PM-7AM, please contact night-coverage [email or web address] Password [facility] 12/17/2016, 6:09 PM

## 2016-12-18 DIAGNOSIS — R110 Nausea: Secondary | ICD-10-CM

## 2016-12-18 DIAGNOSIS — N3000 Acute cystitis without hematuria: Secondary | ICD-10-CM

## 2016-12-18 LAB — CBC
HEMATOCRIT: 36.3 % (ref 36.0–46.0)
Hemoglobin: 11.7 g/dL — ABNORMAL LOW (ref 12.0–15.0)
MCH: 26.4 pg (ref 26.0–34.0)
MCHC: 32.2 g/dL (ref 30.0–36.0)
MCV: 81.8 fL (ref 78.0–100.0)
Platelets: 355 10*3/uL (ref 150–400)
RBC: 4.44 MIL/uL (ref 3.87–5.11)
RDW: 14.9 % (ref 11.5–15.5)
WBC: 9.9 10*3/uL (ref 4.0–10.5)

## 2016-12-18 LAB — CULTURE, BLOOD (ROUTINE X 2)
Culture: NO GROWTH
Culture: NO GROWTH
SPECIAL REQUESTS: ADEQUATE
Special Requests: ADEQUATE

## 2016-12-18 LAB — BASIC METABOLIC PANEL
ANION GAP: 13 (ref 5–15)
BUN: 10 mg/dL (ref 6–20)
CHLORIDE: 100 mmol/L — AB (ref 101–111)
CO2: 25 mmol/L (ref 22–32)
Calcium: 9 mg/dL (ref 8.9–10.3)
Creatinine, Ser: 0.51 mg/dL (ref 0.44–1.00)
GFR calc Af Amer: >60 mL/min (ref >60)
GFR calc non Af Amer: >60 mL/min (ref >60)
GLUCOSE: 107 mg/dL — AB (ref 65–99)
POTASSIUM: 3.8 mmol/L (ref 3.5–5.1)
Sodium: 138 mmol/L (ref 135–145)

## 2016-12-18 LAB — MAGNESIUM: Magnesium: 1.6 mg/dL — ABNORMAL LOW (ref 1.7–2.4)

## 2016-12-18 MED ORDER — SODIUM CHLORIDE 0.9 % IV SOLN: Freq: Once | INTRAVENOUS | Status: DC

## 2016-12-18 MED ORDER — SODIUM CHLORIDE 0.9 % IV SOLN
80.0000 mg/m2 | Freq: Once | INTRAVENOUS | Status: AC
  Administered 2016-12-18: 180 mg via INTRAVENOUS
  Filled 2016-12-18: qty 9

## 2016-12-18 MED ORDER — OLANZAPINE 5 MG PO TABS
10.0000 mg | ORAL_TABLET | Freq: Every day | ORAL | Status: DC
  Administered 2016-12-18 – 2016-12-19 (×2): 10 mg via ORAL
  Filled 2016-12-18 (×2): qty 2

## 2016-12-18 MED ORDER — SODIUM CHLORIDE 0.9 % IV SOLN
10.0000 mg | Freq: Once | INTRAVENOUS | Status: AC
  Administered 2016-12-18: 10 mg via INTRAVENOUS
  Filled 2016-12-18: qty 1

## 2016-12-18 MED ORDER — SODIUM CHLORIDE 0.9% FLUSH
10.0000 mL | INTRAVENOUS | Status: DC | PRN
Start: 2016-12-18 — End: 2016-12-18

## 2016-12-18 MED ORDER — HOT PACK MISC ONCOLOGY
1.0000 | Freq: Once | Status: AC | PRN
  Filled 2016-12-18: qty 1

## 2016-12-18 MED ORDER — SODIUM CHLORIDE 0.9% FLUSH: 3.0000 mL | INTRAVENOUS | Status: DC | PRN

## 2016-12-18 NOTE — Progress Notes (Signed)
Chemotherapy dosage and calculations checked and verified with Nancy Marus RN.

## 2016-12-18 NOTE — Progress Notes (Signed)
PROGRESS NOTE  KEMIYAH Macias  DTO:[id number] DOB: [birth year] DOA: 12/10/2016 PCP: System, Pcp Not In   Brief Narrative: Amanda Macias is a 71 y.o. female who presented for nonproductive cough, dyspnea, generalized weakness and weight loss. CT chest showed a large left lung mass with invasion of the blood vessels, mediastinal lymph nodes and a large pleural effusion. PCCM consulted and recommended IR for thoracentesis (done) and if cytology negative then consider FOB. 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 She was admitted 12/11/2016 to [facility], transferred to [facility] on 12/15/2016 and has begun chemotherapy. Treatment for Enterobacter bacteremia has been ongoing, and plan is for discharge to SNF.  Assessment & Plan: Principal Problem:   Mass of left lung Active Problems:   Cough   Hypokalemia   Sepsis (HCC)   UTI (urinary tract infection)   Pleural effusion   Small cell lung cancer ([place])  Small cell lung cancer: Left lung/mediastinum with large left pleural effusion, negative cytology from thoracentesis 6/18. Bronchoscopy/EBUSon 12/15/2016-extrinsic compression of the left mainstem bronchus with distal endobronchial tumor, status post endobronchial and level 7 node biopsies-pathology consistent with small cell lung cancer. Uric acid ok at 6. - Appreciate oncology recommendations: Began chemotherapy 6/22.  Enterobacter cloacae bacteremia: In blood cultures from 6/16, repeat blood cultures 6/19 were negative. - Continue ceftriaxone per sensitivity data.   E. coli UTI: - Considered adequately treated with susceptible antibiotics which are continued for bacteremia as above.   Hypokalemia, hypomagnesemia:  - Repleted, monitor  DVT prophylaxis: Lovenox Code Status: Full Family Communication: None at bedside this AM Disposition Plan: Plan DC to SNF in next  24-48hrs  Consultants:   Oncology  IR  PCCM  Procedures:   Thoracentesis 6/18  Antimicrobials:  Azithromycin 6/17 - 6/19  Cefepime 6/17 - 6/21  Ceftriaxone 6/21 -   Subjective: Pt without complaints, did not sleep well. Reported some nausea without abdominal pain or vomiting. Appetite remains poor.    Objective: Vitals:   12/17/16 1305 12/17/16 2124 12/18/16 0601 12/18/16 1442  BP:  (!) 138/118 (!) 159/92 113/61  Pulse:  77 75 79  Resp:  20 18 18   Temp:  97.4 F (36.3 C) 97.1 F (36.2 C) 98.3 F (36.8 C)  TempSrc:  Oral Oral Oral  SpO2: 92% 97% 98% (!) 84%  Weight:      Height:        Intake/Output Summary (Last 24 hours) at 12/18/16 1620 Last data filed at 12/17/16 1731  Gross per 24 hour  Intake            312.5 ml  Output                0 ml  Net            312.5 ml   Filed Weights   12/11/16 0224 12/11/16 0457 12/16/16 0540  Weight: 112.5 kg (248 lb 2 oz) 111.9 kg (246 lb 12.8 oz) 116.6 kg (257 lb)    Examination: General exam: 71 y.o. female in no distress  Respiratory system: Non-labored breathing without supplemental oxygen today. Diminished on left with scant rhonchi.  Cardiovascular system: Regular rate and rhythm. No murmur, rub, or gallop. No JVD, and trace pedal edema. Gastrointestinal system: Abdomen soft, non-tender, non-distended, with normoactive bowel sounds. No organomegaly or masses felt. Central nervous system: Alert and oriented. No focal neurological deficits. Extremities: Warm, no deformities Skin: No rashes, lesions  no ulcers Psychiatry: Judgement and insight appear normal. Mood & affect appropriate.   Data Reviewed: I have personally reviewed following labs and imaging studies  CBC:  Recent Labs Lab 12/12/16 0558 12/13/16 0526 12/14/16 0547 12/17/16 0548 12/18/16 0421  WBC 10.2 8.0 7.7 8.9 9.9  HGB 11.7* 12.0 12.3 11.7* 11.7*  HCT 37.5 38.1 39.6 36.9 36.3  MCV 83.7 83.7 84.8 83.1 81.8  PLT 412* 387 403* 395 725    Basic Metabolic Panel:  Recent Labs Lab 12/12/16 0558 12/13/16 0526 12/14/16 0547 12/16/16 1141 12/17/16 0548 12/18/16 0421  NA 137 138 139 134* 134* 138  K 2.9* 3.4* 3.5 2.9* 3.9 3.8  CL 100* 102 104 97* 99* 100*  CO2 26 26 21* 27 23 25   GLUCOSE 87 90 81 87 132* 107*  BUN 7 6 5* 7 7 10   CREATININE 0.70 0.67 0.63 0.58 0.61 0.51  CALCIUM 9.0 9.0 9.0 8.7* 8.7* 9.0  MG 1.6* 2.1  --  1.6* 1.6* 1.6*   GFR: Estimated Creatinine Clearance: 84.7 mL/min (by C-G formula based on SCr of 0.51 mg/dL). Liver Function Tests:  Recent Labs Lab 12/16/16 1141 12/17/16 0548  AST 26 27  ALT 14 16  ALKPHOS 49 52  BILITOT 0.9 1.2  PROT 6.6 6.9  ALBUMIN 3.0* 3.1*   Urine analysis:    Component Value Date/Time   COLORURINE AMBER (A) 12/10/2016 2208   APPEARANCEUR CLOUDY (A) 12/10/2016 2208   LABSPEC 1.034 (H) 12/10/2016 2208   PHURINE 5.0 12/10/2016 2208   GLUCOSEU NEGATIVE 12/10/2016 2208   HGBUR NEGATIVE 12/10/2016 2208   BILIRUBINUR MODERATE (A) 12/10/2016 2208   KETONESUR 20 (A) 12/10/2016 2208   PROTEINUR 100 (A) 12/10/2016 2208   NITRITE NEGATIVE 12/10/2016 2208   LEUKOCYTESUR SMALL (A) 12/10/2016 2208   Recent Results (from the past 240 hour(s))  Urine Culture     Status: Abnormal   Collection Time: 12/10/16 10:08 PM  Result Value Ref Range Status   Specimen Description URINE, RANDOM  Final   Special Requests NONE  Final   Culture >=100,000 COLONIES/mL ESCHERICHIA COLI (A)  Final   Report Status 12/13/2016 FINAL  Final   Organism ID, Bacteria ESCHERICHIA COLI (A)  Final      Susceptibility   Escherichia coli - MIC*    AMPICILLIN >=32 RESISTANT Resistant     CEFAZOLIN <=4 SENSITIVE Sensitive     CEFTRIAXONE <=1 SENSITIVE Sensitive     CIPROFLOXACIN <=0.25 SENSITIVE Sensitive     GENTAMICIN <=1 SENSITIVE Sensitive     IMIPENEM <=0.25 SENSITIVE Sensitive     NITROFURANTOIN <=16 SENSITIVE Sensitive     TRIMETH/SULFA <=20 SENSITIVE Sensitive     AMPICILLIN/SULBACTAM  16 INTERMEDIATE Intermediate     PIP/TAZO <=4 SENSITIVE Sensitive     Extended ESBL NEGATIVE Sensitive     * >=100,000 COLONIES/mL ESCHERICHIA COLI  Blood culture (routine x 2)     Status: Abnormal   Collection Time: 12/10/16 10:15 PM  Result Value Ref Range Status   Specimen Description BLOOD RIGHT ARM  Final   Special Requests   Final    BOTTLES DRAWN AEROBIC AND ANAEROBIC Blood Culture adequate volume   Culture  Setup Time   Final    GRAM NEGATIVE RODS IN BOTH AEROBIC AND ANAEROBIC BOTTLES CRITICAL RESULT CALLED TO, READ BACK BY AND VERIFIED WITH: Ellin Mayhew [place] [phone] MLM    Culture ENTEROBACTER CLOACAE (A)  Final   Report Status 12/13/2016 FINAL  Final   Organism  ID, Bacteria ENTEROBACTER CLOACAE  Final      Susceptibility   Enterobacter cloacae - MIC*    CEFAZOLIN >=64 RESISTANT Resistant     CEFEPIME <=1 SENSITIVE Sensitive     CEFTAZIDIME <=1 SENSITIVE Sensitive     CEFTRIAXONE <=1 SENSITIVE Sensitive     CIPROFLOXACIN <=0.25 SENSITIVE Sensitive     GENTAMICIN <=1 SENSITIVE Sensitive     IMIPENEM <=0.25 SENSITIVE Sensitive     TRIMETH/SULFA <=20 SENSITIVE Sensitive     PIP/TAZO <=4 SENSITIVE Sensitive     * ENTEROBACTER CLOACAE  Blood Culture ID Panel (Reflexed)     Status: Abnormal   Collection Time: 12/10/16 10:15 PM  Result Value Ref Range Status   Enterococcus species NOT DETECTED NOT DETECTED Final   Listeria monocytogenes NOT DETECTED NOT DETECTED Final   Staphylococcus species NOT DETECTED NOT DETECTED Final   Staphylococcus aureus NOT DETECTED NOT DETECTED Final   Streptococcus species NOT DETECTED NOT DETECTED Final   Streptococcus agalactiae NOT DETECTED NOT DETECTED Final   Streptococcus pneumoniae NOT DETECTED NOT DETECTED Final   Streptococcus pyogenes NOT DETECTED NOT DETECTED Final   Acinetobacter baumannii NOT DETECTED NOT DETECTED Final   Enterobacteriaceae species DETECTED (A) NOT DETECTED Final    Comment: Enterobacteriaceae represent a  large family of gram-negative bacteria, not a single organism. CRITICAL RESULT CALLED TO, READ BACK BY AND VERIFIED WITH: PHARMD M [place] [phone] MLM    Enterobacter cloacae complex DETECTED (A) NOT DETECTED Final    Comment: CRITICAL RESULT CALLED TO, READ BACK BY AND VERIFIED WITH: PHARMD M [place] [phone] MLM    Escherichia coli NOT DETECTED NOT DETECTED Final   Klebsiella oxytoca NOT DETECTED NOT DETECTED Final   Klebsiella pneumoniae NOT DETECTED NOT DETECTED Final   Proteus species NOT DETECTED NOT DETECTED Final   Serratia marcescens NOT DETECTED NOT DETECTED Final   Carbapenem resistance NOT DETECTED NOT DETECTED Final   Haemophilus influenzae NOT DETECTED NOT DETECTED Final   Neisseria meningitidis NOT DETECTED NOT DETECTED Final   Pseudomonas aeruginosa NOT DETECTED NOT DETECTED Final   Candida albicans NOT DETECTED NOT DETECTED Final   Candida glabrata NOT DETECTED NOT DETECTED Final   Candida krusei NOT DETECTED NOT DETECTED Final   Candida parapsilosis NOT DETECTED NOT DETECTED Final   Candida tropicalis NOT DETECTED NOT DETECTED Final  Blood culture (routine x 2)     Status: Abnormal   Collection Time: 12/10/16 10:50 PM  Result Value Ref Range Status   Specimen Description BLOOD RIGHT HAND  Final   Special Requests IN PEDIATRIC BOTTLE Blood Culture adequate volume  Final   Culture  Setup Time (A)  Final    GRAM VARIABLE ROD IN PEDIATRIC BOTTLE CRITICAL RESULT CALLED TO, READ BACK BY AND VERIFIED WITH: Hughie Closs, PHARMD AT 1244 12/11/16 BY  L BENFIELD    Culture (A)  Final    BACILLUS SPECIES Standardized susceptibility testing for this organism is not available.    Report Status 12/13/2016 FINAL  Final  Blood Culture ID Panel (Reflexed)     Status: None   Collection Time: 12/10/16 10:50 PM  Result Value Ref Range Status   Enterococcus species NOT DETECTED NOT DETECTED Final   Listeria monocytogenes NOT DETECTED NOT DETECTED Final   Staphylococcus species  NOT DETECTED NOT DETECTED Final   Staphylococcus aureus NOT DETECTED NOT DETECTED Final   Streptococcus species NOT DETECTED NOT DETECTED Final   Streptococcus agalactiae NOT DETECTED NOT DETECTED Final   Streptococcus  pneumoniae NOT DETECTED NOT DETECTED Final   Streptococcus pyogenes NOT DETECTED NOT DETECTED Final   Acinetobacter baumannii NOT DETECTED NOT DETECTED Final   Enterobacteriaceae species NOT DETECTED NOT DETECTED Final   Enterobacter cloacae complex NOT DETECTED NOT DETECTED Final   Escherichia coli NOT DETECTED NOT DETECTED Final   Klebsiella oxytoca NOT DETECTED NOT DETECTED Final   Klebsiella pneumoniae NOT DETECTED NOT DETECTED Final   Proteus species NOT DETECTED NOT DETECTED Final   Serratia marcescens NOT DETECTED NOT DETECTED Final   Haemophilus influenzae NOT DETECTED NOT DETECTED Final   Neisseria meningitidis NOT DETECTED NOT DETECTED Final   Pseudomonas aeruginosa NOT DETECTED NOT DETECTED Final   Candida albicans NOT DETECTED NOT DETECTED Final   Candida glabrata NOT DETECTED NOT DETECTED Final   Candida krusei NOT DETECTED NOT DETECTED Final   Candida parapsilosis NOT DETECTED NOT DETECTED Final   Candida tropicalis NOT DETECTED NOT DETECTED Final  Culture, expectorated sputum-assessment     Status: None   Collection Time: 12/11/16  6:14 AM  Result Value Ref Range Status   Specimen Description EXPECTORATED SPUTUM  Final   Special Requests NONE  Final   Sputum evaluation   Final    Sputum specimen not acceptable for testing.  Please recollect.   RESULT CALLED TO, READ BACK BY AND VERIFIED WITH: Madelin Rear RN 11:30 12/14/16 ([email or web address])    Report Status 12/14/2016 FINAL  Final  Gram stain     Status: None   Collection Time: 12/12/16  4:16 PM  Result Value Ref Range Status   Specimen Description PLEURAL LEFT  Final   Special Requests NONE  Final   Gram Stain   Final    FEW WBC PRESENT, PREDOMINANTLY MONONUCLEAR NO ORGANISMS SEEN    Report Status 12/13/2016  FINAL  Final  Culture, body fluid-bottle     Status: None   Collection Time: 12/12/16  4:16 PM  Result Value Ref Range Status   Specimen Description PLEURAL LEFT  Final   Special Requests BOTTLES DRAWN AEROBIC AND ANAEROBIC  Final   Culture NO GROWTH 5 DAYS  Final   Report Status 12/17/2016 FINAL  Final  Culture, blood (Routine X 2) w Reflex to ID Panel     Status: None   Collection Time: 12/13/16  4:42 PM  Result Value Ref Range Status   Specimen Description BLOOD LEFT HAND  Final   Special Requests IN PEDIATRIC BOTTLE Blood Culture adequate volume  Final   Culture NO GROWTH 5 DAYS  Final   Report Status 12/18/2016 FINAL  Final  Culture, blood (Routine X 2) w Reflex to ID Panel     Status: None   Collection Time: 12/13/16  4:47 PM  Result Value Ref Range Status   Specimen Description BLOOD LEFT HAND  Final   Special Requests IN PEDIATRIC BOTTLE Blood Culture adequate volume  Final   Culture NO GROWTH 5 DAYS  Final   Report Status 12/18/2016 FINAL  Final  Surgical pcr screen     Status: None   Collection Time: 12/14/16  7:00 PM  Result Value Ref Range Status   MRSA, PCR NEGATIVE NEGATIVE Final   Staphylococcus aureus NEGATIVE NEGATIVE Final    Comment:        The Xpert SA Assay (FDA approved for NASAL specimens in patients over 57 years of age), is one component of a comprehensive surveillance program.  Test performance has been validated by [facility] for patients greater than or equal to 1  year old. It is not intended to diagnose infection nor to guide or monitor treatment.   Culture, expectorated sputum-assessment     Status: None   Collection Time: 12/15/16  2:23 AM  Result Value Ref Range Status   Specimen Description Expect. Sput  Final   Special Requests NONE  Final   Sputum evaluation   Final    Sputum specimen not acceptable for testing.  Please recollect.   Gram Stain Report Called to,Read Back By and Verified With: S. WARD RN, AT [phone] 12/15/16 BY D.  Victoriano Lain    Report Status 12/15/2016 FINAL  Final      Radiology Studies: Ct Abdomen Pelvis W Contrast  Result Date: 12/16/2016 CLINICAL DATA:  Small cell carcinoma EXAM: CT ABDOMEN AND PELVIS WITH CONTRAST TECHNIQUE: Multidetector CT imaging of the abdomen and pelvis was performed using the standard protocol following bolus administration of intravenous contrast. CONTRAST:  173mL ISOVUE-300 IOPAMIDOL (ISOVUE-300) INJECTION 61% COMPARISON:  Partial comparison to CT chest dated 12/11/2016 FINDINGS: Lower chest: Unchanged from recent CT chest, noting large left pleural effusion, underlying left lung atelectasis/collapse, and thoracic lymphadenopathy. Hepatobiliary: Postsurgical changes along the gallbladder fossa (series 2/ image 37). Liver is otherwise within normal limits. Status post cholecystectomy. No intrahepatic or extrahepatic ductal dilatation. Pancreas: Within normal limits. Spleen: Within normal limits. Splenule in the left mid abdomen (series 2/ image 42). Adrenals/Urinary Tract: Adrenal glands are within normal limits. Kidneys are within normal limits.  No hydronephrosis. Bladder is within normal limits. Stomach/Bowel: Stomach is within normal limits. No evidence of bowel obstruction. Normal appendix (series 2/ image 61). Left colonic diverticulosis, without evidence of diverticulitis. Vascular/Lymphatic: No evidence of abdominal aortic aneurysm. Atherosclerotic calcifications of the abdominal aorta and branch vessels. No suspicious abdominopelvic lymphadenopathy. Reproductive: Uterus is within normal limits. Bilateral ovaries are within normal limits. Other: No abdominopelvic ascites. Musculoskeletal: Degenerative changes of the visualized thoracolumbar spine. IMPRESSION: No evidence of metastatic disease in the abdomen/pelvis. Lower thoracic findings are unchanged from recent CT chest. Electronically Signed   By: Julian Hy M.D.   On: 12/16/2016 17:09    Scheduled Meds: .  dextromethorphan-guaiFENesin  1 tablet Oral BID  . enoxaparin (LOVENOX) injection  0.5 mg/kg Subcutaneous Q24H  . nystatin   Topical TID  . OLANZapine  10 mg Oral Daily  . senna-docusate  2 tablet Oral BID  . sodium chloride flush  10-40 mL Intracatheter Q12H  . sodium chloride flush  3 mL Intravenous Q12H   Continuous Infusions: . sodium chloride 75 mL/hr at 12/17/16 1731  . cefTRIAXone (ROCEPHIN)  IV Stopped (12/18/16 1032)  . lactated ringers 50 mL/hr at 12/15/16 1001  . magnesium sulfate 1 - 4 g bolus IVPB       LOS: 7 days   Time spent: 25 minutes.  Vance Gather, MD [facility] Pager [phone]  If 7PM-7AM, please contact night-coverage [email or web address] Password [email or web address] 12/18/2016, 4:20 PM

## 2016-12-18 NOTE — Progress Notes (Signed)
Amanda Macias has a little bit more nausea. She did not sleep all that well last night. She is on some Ambien.  I will try some Zyprexa for the nausea.  She's had no pain. Her breathing seems to be doing okay. She is not wearing any oxygen.  There is no diarrhea.  She's had no bleeding.  She's had no fever.  Her labs looked okay. Her creatinine is 0.51. Her white cell count is 9.9.  Her physical exam shows her temperature to be 97.1. Pulse 75. Blood pressure 159/92. Her lungs sound clearer over on the right side. She has decreased on the left side. Cardiac exam regular rate and rhythm. Abdomen is obese but soft. Extremities shows 1+ edema. Neurological exam is nonfocal.  Amanda Macias has small cell lung cancer. She is on her first cycle of chemotherapy with carboplatin/etoposide. She will receive day 3 of treatment today.  Hopefully, the Zyprexa will help.  I would think that the chances of improving her small cell lung cancer should be significant.  Lattie Haw, MD

## 2016-12-19 ENCOUNTER — Other Ambulatory Visit: Payer: Self-pay | Admitting: *Deleted

## 2016-12-19 DIAGNOSIS — C3490 Malignant neoplasm of unspecified part of unspecified bronchus or lung: Secondary | ICD-10-CM

## 2016-12-19 DIAGNOSIS — B960 Mycoplasma pneumoniae [M. pneumoniae] as the cause of diseases classified elsewhere: Secondary | ICD-10-CM

## 2016-12-19 DIAGNOSIS — R7881 Bacteremia: Secondary | ICD-10-CM

## 2016-12-19 DIAGNOSIS — N390 Urinary tract infection, site not specified: Secondary | ICD-10-CM

## 2016-12-19 DIAGNOSIS — B9689 Other specified bacterial agents as the cause of diseases classified elsewhere: Secondary | ICD-10-CM | POA: Diagnosis present

## 2016-12-19 LAB — BASIC METABOLIC PANEL
Anion gap: 10 (ref 5–15)
BUN: 13 mg/dL (ref 6–20)
CALCIUM: 8.8 mg/dL — AB (ref 8.9–10.3)
CO2: 27 mmol/L (ref 22–32)
CREATININE: 0.52 mg/dL (ref 0.44–1.00)
Chloride: 99 mmol/L — ABNORMAL LOW (ref 101–111)
GFR calc Af Amer: >60 mL/min (ref >60)
GFR calc non Af Amer: >60 mL/min (ref >60)
GLUCOSE: 133 mg/dL — AB (ref 65–99)
Potassium: 3.6 mmol/L (ref 3.5–5.1)
Sodium: 136 mmol/L (ref 135–145)

## 2016-12-19 LAB — CBC
HCT: 35.7 % — ABNORMAL LOW (ref 36.0–46.0)
Hemoglobin: 11.4 g/dL — ABNORMAL LOW (ref 12.0–15.0)
MCH: 26.1 pg (ref 26.0–34.0)
MCHC: 31.9 g/dL (ref 30.0–36.0)
MCV: 81.9 fL (ref 78.0–100.0)
PLATELETS: 305 10*3/uL (ref 150–400)
RBC: 4.36 MIL/uL (ref 3.87–5.11)
RDW: 14.9 % (ref 11.5–15.5)
WBC: 7.9 10*3/uL (ref 4.0–10.5)

## 2016-12-19 MED ORDER — ALBUTEROL SULFATE (2.5 MG/3ML) 0.083% IN NEBU: 2.5000 mg | INHALATION_SOLUTION | RESPIRATORY_TRACT | 12 refills | Status: AC | PRN

## 2016-12-19 MED ORDER — TBO-FILGRASTIM 300 MCG/0.5ML ~~LOC~~ SOSY
300.0000 ug | PREFILLED_SYRINGE | Freq: Every morning | SUBCUTANEOUS | Status: DC
  Filled 2016-12-19: qty 0.5

## 2016-12-19 MED ORDER — SULFAMETHOXAZOLE-TRIMETHOPRIM 800-160 MG PO TABS: 1.0000 | ORAL_TABLET | Freq: Two times a day (BID) | ORAL | 0 refills | Status: AC

## 2016-12-19 NOTE — Progress Notes (Signed)
Pt from home alone and for SNF placement at discharge. Marney Doctor RN,BSN,NCM [phone]

## 2016-12-19 NOTE — Progress Notes (Signed)
Report called to judy at [facility]. [facility] called for Transport.

## 2016-12-19 NOTE — Clinical Social Work Placement (Signed)
Pt discharging to [facility] today. All information sent to facility via the [facility] notified- pt states she will tell brother but friend is at bedside and aware.  [facility] transportation needed- CSW completed medical necessity form - transportation will need to be called following removal of PICC. Report # for RN: [phone]  See below for placement details   CLINICAL SOCIAL WORK PLACEMENT  NOTE  Date:  12/19/2016  Patient Details  Name: Amanda Macias MRN: [id number] Date of Birth: December 15, 1945  Clinical Social Work is seeking post-discharge placement for this patient at the [place] level of care (*CSW will initial, date and re-position this form in  chart as items are completed):  Yes   Patient/family provided with [facility] Work Department's list of facilities offering this level of care within the geographic area requested by the patient (or if unable, by the patient's family).  Yes   Patient/family informed of their freedom to choose among providers that offer the needed level of care, that participate in Medicare, Medicaid or managed care program needed by the patient, have an available bed and are willing to accept the patient.  Yes   Patient/family informed of [facility]'s ownership interest in [facility] and [facility], as well as of the fact that they are under no obligation to receive care at these facilities.  PASRR submitted to EDS on 12/14/16     PASRR number received on 12/14/16     Existing PASRR number confirmed on       FL2 transmitted to all facilities in geographic area requested by pt/family on 12/14/16     FL2 transmitted to all facilities within larger geographic area on       Patient informed that his/her managed care company has contracts with or will negotiate with certain facilities, including the following:        Yes   Patient/family informed of bed offers received.  Patient chooses bed at [facility]     Physician recommends and patient chooses bed at [facility]    Patient to be transferred to [facility] on 12/19/16.  Patient to be transferred to facility by [facility]     Patient family notified on 12/19/16 of transfer.  Name of family member notified:  friend [facility]     PHYSICIAN       Additional Comment:    _______________________________________________ Nila Nephew, LCSW 12/19/2016, 3:39 PM  [phone]

## 2016-12-19 NOTE — Progress Notes (Signed)
IP PROGRESS NOTE  Subjective:   She completed the first cycle of etoposide/carboplatin yesterday. No complaint today. The dyspnea has improved compared to hospital admission.  Objective: Vital signs in last 24 hours: Blood pressure (!) 156/117, pulse (!) 124, temperature 97.9 F (36.6 C), temperature source Oral, resp. rate 20, height 5\' 5"  (1.651 m), weight 257 lb (116.6 kg), SpO2 98 %.  Intake/Output from previous day: 06/24 0701 - 06/25 0700 In: 1836.3 [I.V.:1836.3] Out: -   Physical Exam: HEENT: No thrush Lungs: Decreased breath sounds throughout the left chest, no respiratory distress Cardiac: Regular rate and rhythm Extremities: No leg edema, trace edema of the left lower arm  Right PICC site without erythema  Lab Results:  Recent Labs  12/18/16 0421 12/19/16 0400  WBC 9.9 7.9  HGB 11.7* 11.4*  HCT 36.3 35.7*  PLT 355 305    BMET  Recent Labs  12/18/16 0421 12/19/16 0400  NA 138 136  K 3.8 3.6  CL 100* 99*  CO2 25 27  GLUCOSE 107* 133*  BUN 10 13  CREATININE 0.51 0.52  CALCIUM 9.0 8.8*    Studies/Results: No results found.  Medications: I have reviewed the patient's current medications.  Assessment/Plan:  1. Small cell lung cancer-Left lung/mediastinal mass with a large left pleural effusion  -negative pleural fluid cytology 12/12/2016  Bronchoscopy/EBUS on 12/15/2016-extrinsic compression of the left mainstem bronchus with distal endobronchial tumor, status post endobronchial and level 7 node biopsies-pathology consistent with small cell lung cancer  Left neck and left axillary lymph nodes on physical exam 12/15/2016  Bone scan 12/16/2016-no evidence of bone metastases  CT abdomen/pelvis 12/16/2016-no evidence of metastatic disease  Cycle 1 etoposide/carboplatin 12/16/2016  2. 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 Dyspnea/cough secondary to #1  4. Escherichia coli urinary tract infection and  Enterobacter bacteremia/bacillus species on blood cultures 12/10/2016, no evidence of ongoing infection, source for bacteremia unclear  Ms. Sawyers tolerated the first cycle of chemotherapy well. The plan is for discharge to a skilled nursing facility. I will arrange for Neulasta at the [facility] on 12/20/2016. The plan is for cycle 2 etoposide/carboplatin at a 4 week interval.  We will arrange for outpatient Port-A-Cath placement as needed.     LOS: 8 days   Donneta Romberg, MD   12/19/2016, 2:49 PM

## 2016-12-19 NOTE — Discharge Summary (Addendum)
Physician Discharge Summary  Amanda Macias:[id number] DOB: [birth year] DOA: 12/10/2016  PCP: System, Pcp Not In  Admit date: 12/10/2016 Discharge date: 12/19/2016  Admitted From: Home Disposition: SNF   Recommendations for Outpatient Follow-up:  1. Follow up with oncology in 2 weeks. Received first chemotherapy 6/22-6/24. Will need neulasta in the interim and outpatient placement of port for ongoing therapy. 2. Please obtain BMP/CBC in one week  Home Health: N/A Equipment/Devices: 2L oxygen by nasal cannula Discharge Condition: Stable CODE STATUS: Full Diet recommendation: Heart healthy  Brief/Interim Summary: Amanda Macias is a 71 y.o. female who presented for nonproductive cough, dyspnea, generalized weakness and weight loss. CT chest showed a large left lung mass with invasion of the blood vessels, mediastinal lymph nodes and a large pleural effusion. Thoracentesis was performed 6/18 with negative cytology, and subsequent EBUSon 12/15/2016 showed pathology consistent with small cell lung carcinoma. She was admitted 12/11/2016 to [facility], transferred to [facility] on 12/15/2016 and received chemotherapy 6/22-6/24. Treatment for Enterobacter bacteremia has been ongoing with resolution of mild leukocytosis 6/17 and no fevers, and plan is for discharge to SNF to complete 10 total days of antibiotics with bactrim.  Discharge Diagnoses:  Principal Problem:   Mass of left lung Active Problems:   Cough   Hypokalemia   Sepsis (HCC)   UTI (urinary tract infection)   Pleural effusion   Small cell lung cancer ([place])  Small cell lung cancer: Left lung/mediastinum with large left pleural effusion, negative cytology from thoracentesis 6/18. Bronchoscopy/EBUSon 12/15/2016-extrinsic compression of the left mainstem bronchus with distal endobronchial tumor, status post endobronchial and level 7 node biopsies-pathology consistent with small cell lung cancer.  - Appreciate  oncology recommendations: Began chemotherapy (carboplatin/etoposide) 6/22-6/24. Uric acid ok at 6. - Will follow up with oncology.  - Follow up CBC in 1 week, received granix 6/25. - Zyprexa initially provided for nausea, though this seems to have caused somnolence. Monitor and treat symptoms as necessary.   Acute respiratory failure: Due to lung cancer. - Intermittently required oxygen. - Continue inhaled bronchodilators as needed - Consider sleep study as outpatient  Enterobacter cloacae bacteremia: In blood cultures from 6/16, repeat blood cultures 6/19 were negative. Discussed treatment with ID, Dr. Megan Salon 6/25, who recommends 10 total days of effective antibiotics.  - Received cefepime 6/17-6/21, ceftriaxone thereafter. Plan to complete therapy with bactrim (final dose will be 6/26 PM).  E. coli UTI: - Considered adequately treated with susceptible antibiotics which are continued for bacteremia as above.   Anemia: Mild, normocytic, acute due to chemotherapy.  - Monitor at follow up.   Hypokalemia, hypomagnesemia:  - Repleted, monitor both in 1 week.  Discharge Instructions Discharge Instructions    SCHEDULING COMMUNICATION    Complete by:  As directed    Chemotherapy Appointment - 2 hr   SCHEDULING COMMUNICATION    Complete by:  As directed    Chemotherapy Appointment - 2 hr     Allergies as of 12/19/2016      Reactions   No Known Allergies       Medication List    TAKE these medications   albuterol (2.5 MG/3ML) 0.083% nebulizer solution Commonly known as:  PROVENTIL Take 3 mLs (2.5 mg total) by nebulization every 4 (four) hours as needed for wheezing or shortness of breath.   sulfamethoxazole-trimethoprim 800-160 MG tablet Commonly known as:  BACTRIM DS,SEPTRA DS Take 1 tablet by mouth 2 (two) times daily.       Contact  information for follow-up providers    [place]. Schedule an appointment as soon as possible for a visit in 2 week(s).             Contact information for after-discharge care    Destination    [facility] Follow up.   Specialty:  [place] information: [place] [phone]                 Allergies  Allergen Reactions  . No Known Allergies     Consultations:  Oncology  IR  PCCM  Procedures/Studies: Dg Chest 1 View  Result Date: 12/12/2016 CLINICAL DATA:  Thoracentesis. EXAM: CHEST 1 VIEW COMPARISON:  Ultrasound same day. CT chest 12/11/2016. Chest x-ray 12/10/2016. FINDINGS: Interim decrease in left pleural effusion. No pneumothorax post thoracentesis . Prominent persistent opacification of the left hemithorax remains. IMPRESSION: Interim decrease in left-sided pleural effusion. No pneumothorax following left-sided thoracentesis. Electronically Signed   By: Marcello Moores  Register   On: 12/12/2016 16:33   Dg Chest 2 View  Result Date: 12/10/2016 CLINICAL DATA:  Cough and nasal congestion for 1 month. EXAM: CHEST  2 VIEW COMPARISON:  None. FINDINGS: There is complete opacification of the left hemithorax. No right-sided pneumothorax. The visualized right lung is clear. The cardiomediastinal silhouette is partially obscured by the left-sided opacification but no abnormalities are seen. IMPRESSION: Complete opacification of the left hemithorax. A CT scan could further evaluate if clinically warranted. Electronically Signed   By: Dorise Bullion III M.D   On: 12/10/2016 22:18   Ct Angio Chest Pe W Or Wo Contrast  Result Date: 12/11/2016 CLINICAL DATA:  Follow-up abnormal chest x-ray EXAM: CT ANGIOGRAPHY CHEST WITH CONTRAST TECHNIQUE: Multidetector CT imaging of the chest was performed using the standard protocol during bolus administration of intravenous contrast. Multiplanar CT image reconstructions and MIPs were obtained to evaluate the vascular anatomy. CONTRAST:  80 mL Isovue 370 intravenous  COMPARISON:  Radiograph 12/10/2016 FINDINGS: Cardiovascular: Excessive respiratory motion artifact limits the examination. No central embolus is visualized. Tiny hypodensities within sub segmental right lower lobe branch vessels could be due to artifact although tiny emboli are not excluded. Non aneurysmal aorta. Atherosclerosis. No dissection is seen. There is narrowing of the left pulmonary artery and multiple left upper lobe branch vessels due to a large mass in the lung. There is tumor in case min and narrowing of the left common carotid and subclavian vessels. Central venous obstruction by tumor with multiple chest wall and paraspinal collateral vessels filling the SVC and brachiocephalic vessels. Mediastinum/Nodes: Midline trachea. Right lobe of thyroid within normal limits. Poorly defined left lobe of thyroid. Esophagus unremarkable. Infiltrative soft tissue mass extends into the superior mediastinum/ thoracic inlet and involves the left hilar structures. Tumor or matted adenopathy within the precarinal space. Soft tissue mass in the subcarinal region measuring at least 2.9 cm. Slight narrowed appearance of the left bronchus. Lungs/Pleura: Large left pleural effusion with mild shift to the right. There is atelectasis in the left lower lobe. Ill-defined soft tissue mass in the left upper lobe, precise measurements difficult due to the presence of adjacent pleural effusion and poorly defined appearance of the mass. Mass measures at least 8.6 cm transverse by 9.1 cm AP by 12.8 cm craniocaudad. Upper Abdomen: Surgical clips in the gallbladder fossa. No acute abnormality is seen. Musculoskeletal: No acute osseous abnormality. Review of the MIP images confirms the above findings. IMPRESSION: 1.  Excessive respiratory motion artifact limits the study. No central embolus is visualized. Under filling of subsegmental right lower lobe pulmonary artery branch vessels could be secondary to artifact although small emboli  are difficult to exclude. 2. Large left pleural effusion. Large left upper lobe infiltrative lung mass extending into the left apex. There is mediastinal and hilar invasion by the mass. Vascular encasement of the left pulmonary artery and upper lobe arterial branches by tumor. Tumor encasement of the great vessels of the aortic arch, primarily the left common carotid and subclavian vessels. 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 3. Soft tissue density within the left hilus and subcarinal region could relate to additional tumor or matted adenopathy. Aortic Atherosclerosis (ICD10-I70.0). Electronically Signed   By: Donavan Foil M.D.   On: 12/11/2016 02:07   Nm Bone Scan Whole Body  Result Date: 12/16/2016 CLINICAL DATA:  71 year old female with recently diagnosed large left lung mass with mediastinal involvement. Generalized pain. EXAM: NUCLEAR MEDICINE WHOLE BODY BONE SCAN TECHNIQUE: Whole body anterior and posterior images were obtained approximately 3 hours after intravenous injection of radiopharmaceutical. RADIOPHARMACEUTICALS:  21.7 mCi Technetium-78m MDP IV COMPARISON:  CTA chest 12/11/2016 FINDINGS: Infiltration of the patient left upper extremity IV such that a moderate amount of the radiotracer dose deposited in the subcutaneous soft tissues about the left shoulder. Surrounding small nodular areas of radiotracer activity likely represent lymphatic uptake, and this might also explain several small foci of activity projecting over the posterior left ribs. Despite the infiltration there is good radiotracer activity in the axial skeleton. There is visible radiotracer activity in both kidneys and the urinary bladder. Homogeneous radiotracer activity throughout the axial skeleton, including the skull, aside from the small posterior left rib foci stated above. There is less adequate appendicular skeleton  radiotracer activity, although activity at both shoulders, in the proximal right humerus, and about both hips appears normal. Heterogeneous increased activity at both knees appears degenerative in nature. IMPRESSION: 1. Study complicated by a infiltration of the radiotracer dose into the left upper arm. And this in turn may be related to abnormal extremity venous pressure due to central venous instruction by the left chest tumor. 2. No definite findings specific for metastatic disease to bone. Electronically Signed   By: Genevie Ann M.D.   On: 12/16/2016 15:10   Ct Abdomen Pelvis W Contrast  Result Date: 12/16/2016 CLINICAL DATA:  Small cell carcinoma EXAM: CT ABDOMEN AND PELVIS WITH CONTRAST TECHNIQUE: Multidetector CT imaging of the abdomen and pelvis was performed using the standard protocol following bolus administration of intravenous contrast. CONTRAST:  116mL ISOVUE-300 IOPAMIDOL (ISOVUE-300) INJECTION 61% COMPARISON:  Partial comparison to CT chest dated 12/11/2016 FINDINGS: Lower chest: Unchanged from recent CT chest, noting large left pleural effusion, underlying left lung atelectasis/collapse, and thoracic lymphadenopathy. Hepatobiliary: Postsurgical changes along the gallbladder fossa (series 2/ image 37). Liver is otherwise within normal limits. Status post cholecystectomy. No intrahepatic or extrahepatic ductal dilatation. Pancreas: Within normal limits. Spleen: Within normal limits. Splenule in the left mid abdomen (series 2/ image 42). Adrenals/Urinary Tract: Adrenal glands are within normal limits. Kidneys are within normal limits.  No hydronephrosis. Bladder is within normal limits. Stomach/Bowel: Stomach is within normal limits. No evidence of bowel obstruction. Normal appendix (series 2/ image 61). Left colonic diverticulosis, without evidence of diverticulitis. Vascular/Lymphatic: No evidence of abdominal aortic aneurysm. Atherosclerotic calcifications of the abdominal aorta and branch vessels.  No  suspicious abdominopelvic lymphadenopathy. Reproductive: Uterus is within normal limits. Bilateral ovaries are within normal limits. Other: No abdominopelvic ascites. Musculoskeletal: Degenerative changes of the visualized thoracolumbar spine. IMPRESSION: No evidence of metastatic disease in the abdomen/pelvis. Lower thoracic findings are unchanged from recent CT chest. Electronically Signed   By: Julian Hy M.D.   On: 12/16/2016 17:09   Dg Chest Port 1 View  Result Date: 12/15/2016 CLINICAL DATA:  Status post bronchoscopy. The patient is experiencing coughing and shortness of breath. EXAM: PORTABLE CHEST 1 VIEW COMPARISON:  Chest x-ray of December 12, 2016 FINDINGS: There is complete opacification of the left hemithorax today. There is no significant mediastinal shift. The right lung is clear. The right heart border is normal and the pulmonary vascularity is not engorged. There is degenerative change of both shoulders. IMPRESSION: Complete opacification of the left hemithorax compatible with total lung atelectasis and/or pleural fluid. There is no significant mediastinal shift. Electronically Signed   By: David  Martinique M.D.   On: 12/15/2016 13:36   Ir Thoracentesis Asp Pleural Space W/img Guide  Result Date: 12/12/2016 INDICATION: New left-sided pleural effusion with possible left lung mass. Request is made for diagnostic and therapeutic thoracentesis. EXAM: ULTRASOUND GUIDED DIAGNOSTIC AND THERAPEUTIC THORACENTESIS MEDICATIONS: 1% lidocaine COMPLICATIONS: None immediate. PROCEDURE: An ultrasound guided thoracentesis was thoroughly discussed with the patient and questions answered. The benefits, risks, alternatives and complications were also discussed. The patient understands and wishes to proceed with the procedure. Written consent was obtained. Ultrasound was performed to localize and mark an adequate pocket of fluid in the left chest. The area was then prepped and draped in the normal sterile  fashion. 1% Lidocaine was used for local anesthesia. Under ultrasound guidance a Safe-T-Centesis catheter was introduced. Thoracentesis was performed. The catheter was removed and a dressing applied. FINDINGS: A total of approximately 2 L of serosanguineous fluid was removed. Samples were sent to the laboratory as requested by the clinical team. IMPRESSION: Successful ultrasound guided left thoracentesis yielding 2 L of pleural fluid. Read by: Saverio Danker, PA-C Electronically Signed   By: Marybelle Killings M.D.   On: 12/12/2016 16:46   Subjective: Pt sleeping soundly but rousable, oriented. Denies nausea, vomiting, diarrhea. No fevers. Doesn't feel short of breath.   Discharge Exam: Vitals:   12/18/16 2112 12/19/16 0505  BP: (!) 141/83 (!) 159/85  Pulse: 78 88  Resp: 20 19  Temp: 97.6 F (36.4 C) 97.9 F (36.6 C)   General: Pt is alert, awake, not in acute distress Cardiovascular: RRR, S1/S2 +, no rubs, no gallops Respiratory: Diminished on left with scant rhonchi.  Abdominal: Soft, NT, ND, bowel sounds + Extremities: No edema, no cyanosis  Labs: BNP (last 3 results)  Recent Labs  12/10/16 2214  BNP 40.1   Basic Metabolic Panel:  Recent Labs Lab 12/13/16 0526 12/14/16 0547 12/16/16 1141 12/17/16 0548 12/18/16 0421 12/19/16 0400  NA 138 139 134* 134* 138 136  K 3.4* 3.5 2.9* 3.9 3.8 3.6  CL 102 104 97* 99* 100* 99*  CO2 26 21* 27 23 25 27   GLUCOSE 90 81 87 132* 107* 133*  BUN 6 5* 7 7 10 13   CREATININE 0.67 0.63 0.58 0.61 0.51 0.52  CALCIUM 9.0 9.0 8.7* 8.7* 9.0 8.8*  MG 2.1  --  1.6* 1.6* 1.6*  --    Liver Function Tests:  Recent Labs Lab 12/16/16 1141 12/17/16 0548  AST 26 27  ALT 14 16  ALKPHOS 49 52  BILITOT 0.9 1.2  PROT 6.6 6.9  ALBUMIN 3.0* 3.1*   No results for input(s): LIPASE, AMYLASE in the last 168 hours. No results for input(s): AMMONIA in the last 168 hours. CBC:  Recent Labs Lab 12/13/16 0526 12/14/16 0547 12/17/16 0548 12/18/16 0421  12/19/16 0400  WBC 8.0 7.7 8.9 9.9 7.9  HGB 12.0 12.3 11.7* 11.7* 11.4*  HCT 38.1 39.6 36.9 36.3 35.7*  MCV 83.7 84.8 83.1 81.8 81.9  PLT 387 403* 395 355 305   Cardiac Enzymes: No results for input(s): CKTOTAL, CKMB, CKMBINDEX, TROPONINI in the last 168 hours. BNP: Invalid input(s): POCBNP CBG: No results for input(s): GLUCAP in the last 168 hours. D-Dimer No results for input(s): DDIMER in the last 72 hours. Hgb A1c No results for input(s): HGBA1C in the last 72 hours. Lipid Profile No results for input(s): CHOL, HDL, LDLCALC, TRIG, CHOLHDL, LDLDIRECT in the last 72 hours. Thyroid function studies No results for input(s): TSH, T4TOTAL, T3FREE, THYROIDAB in the last 72 hours.  Invalid input(s): FREET3 Anemia work up No results for input(s): VITAMINB12, FOLATE, FERRITIN, TIBC, IRON, RETICCTPCT in the last 72 hours. Urinalysis    Component Value Date/Time   COLORURINE AMBER (A) 12/10/2016 2208   APPEARANCEUR CLOUDY (A) 12/10/2016 2208   LABSPEC 1.034 (H) 12/10/2016 2208   PHURINE 5.0 12/10/2016 2208   GLUCOSEU NEGATIVE 12/10/2016 2208   HGBUR NEGATIVE 12/10/2016 2208   BILIRUBINUR MODERATE (A) 12/10/2016 2208   KETONESUR 20 (A) 12/10/2016 2208   PROTEINUR 100 (A) 12/10/2016 2208   NITRITE NEGATIVE 12/10/2016 2208   LEUKOCYTESUR SMALL (A) 12/10/2016 2208    Microbiology Recent Results (from the past 240 hour(s))  Urine Culture     Status: Abnormal   Collection Time: 12/10/16 10:08 PM  Result Value Ref Range Status   Specimen Description URINE, RANDOM  Final   Special Requests NONE  Final   Culture >=100,000 COLONIES/mL ESCHERICHIA COLI (A)  Final   Report Status 12/13/2016 FINAL  Final   Organism ID, Bacteria ESCHERICHIA COLI (A)  Final      Susceptibility   Escherichia coli - MIC*    AMPICILLIN >=32 RESISTANT Resistant     CEFAZOLIN <=4 SENSITIVE Sensitive     CEFTRIAXONE <=1 SENSITIVE Sensitive     CIPROFLOXACIN <=0.25 SENSITIVE Sensitive     GENTAMICIN <=1  SENSITIVE Sensitive     IMIPENEM <=0.25 SENSITIVE Sensitive     NITROFURANTOIN <=16 SENSITIVE Sensitive     TRIMETH/SULFA <=20 SENSITIVE Sensitive     AMPICILLIN/SULBACTAM 16 INTERMEDIATE Intermediate     PIP/TAZO <=4 SENSITIVE Sensitive     Extended ESBL NEGATIVE Sensitive     * >=100,000 COLONIES/mL ESCHERICHIA COLI  Blood culture (routine x 2)     Status: Abnormal   Collection Time: 12/10/16 10:15 PM  Result Value Ref Range Status   Specimen Description BLOOD RIGHT ARM  Final   Special Requests   Final    BOTTLES DRAWN AEROBIC AND ANAEROBIC Blood Culture adequate volume   Culture  Setup Time   Final    GRAM NEGATIVE RODS IN BOTH AEROBIC AND ANAEROBIC BOTTLES CRITICAL RESULT CALLED TO, READ BACK BY AND VERIFIED WITH: Ellin Mayhew [place] [phone] MLM    Culture ENTEROBACTER CLOACAE (A)  Final   Report Status 12/13/2016 FINAL  Final   Organism ID, Bacteria ENTEROBACTER CLOACAE  Final      Susceptibility   Enterobacter cloacae - MIC*    CEFAZOLIN >=64 RESISTANT Resistant     CEFEPIME <=1 SENSITIVE Sensitive  CEFTAZIDIME <=1 SENSITIVE Sensitive     CEFTRIAXONE <=1 SENSITIVE Sensitive     CIPROFLOXACIN <=0.25 SENSITIVE Sensitive     GENTAMICIN <=1 SENSITIVE Sensitive     IMIPENEM <=0.25 SENSITIVE Sensitive     TRIMETH/SULFA <=20 SENSITIVE Sensitive     PIP/TAZO <=4 SENSITIVE Sensitive     * ENTEROBACTER CLOACAE  Blood Culture ID Panel (Reflexed)     Status: Abnormal   Collection Time: 12/10/16 10:15 PM  Result Value Ref Range Status   Enterococcus species NOT DETECTED NOT DETECTED Final   Listeria monocytogenes NOT DETECTED NOT DETECTED Final   Staphylococcus species NOT DETECTED NOT DETECTED Final   Staphylococcus aureus NOT DETECTED NOT DETECTED Final   Streptococcus species NOT DETECTED NOT DETECTED Final   Streptococcus agalactiae NOT DETECTED NOT DETECTED Final   Streptococcus pneumoniae NOT DETECTED NOT DETECTED Final   Streptococcus pyogenes NOT DETECTED NOT DETECTED  Final   Acinetobacter baumannii NOT DETECTED NOT DETECTED Final   Enterobacteriaceae species DETECTED (A) NOT DETECTED Final    Comment: Enterobacteriaceae represent a large family of gram-negative bacteria, not a single organism. CRITICAL RESULT CALLED TO, READ BACK BY AND VERIFIED WITH: PHARMD M [place] [phone] MLM    Enterobacter cloacae complex DETECTED (A) NOT DETECTED Final    Comment: CRITICAL RESULT CALLED TO, READ BACK BY AND VERIFIED WITH: PHARMD M [employer] [phone] MLM    Escherichia coli NOT DETECTED NOT DETECTED Final   Klebsiella oxytoca NOT DETECTED NOT DETECTED Final   Klebsiella pneumoniae NOT DETECTED NOT DETECTED Final   Proteus species NOT DETECTED NOT DETECTED Final   Serratia marcescens NOT DETECTED NOT DETECTED Final   Carbapenem resistance NOT DETECTED NOT DETECTED Final   Haemophilus influenzae NOT DETECTED NOT DETECTED Final   Neisseria meningitidis NOT DETECTED NOT DETECTED Final   Pseudomonas aeruginosa NOT DETECTED NOT DETECTED Final   Candida albicans NOT DETECTED NOT DETECTED Final   Candida glabrata NOT DETECTED NOT DETECTED Final   Candida krusei NOT DETECTED NOT DETECTED Final   Candida parapsilosis NOT DETECTED NOT DETECTED Final   Candida tropicalis NOT DETECTED NOT DETECTED Final  Blood culture (routine x 2)     Status: Abnormal   Collection Time: 12/10/16 10:50 PM  Result Value Ref Range Status   Specimen Description BLOOD RIGHT HAND  Final   Special Requests IN PEDIATRIC BOTTLE Blood Culture adequate volume  Final   Culture  Setup Time (A)  Final    GRAM VARIABLE ROD IN PEDIATRIC BOTTLE CRITICAL RESULT CALLED TO, READ BACK BY AND VERIFIED WITH: Hughie Closs, PHARMD AT 1244 12/11/16 BY  L BENFIELD    Culture (A)  Final    BACILLUS SPECIES Standardized susceptibility testing for this organism is not available.    Report Status 12/13/2016 FINAL  Final  Blood Culture ID Panel (Reflexed)     Status: None   Collection Time: 12/10/16 10:50 PM   Result Value Ref Range Status   Enterococcus species NOT DETECTED NOT DETECTED Final   Listeria monocytogenes NOT DETECTED NOT DETECTED Final   Staphylococcus species NOT DETECTED NOT DETECTED Final   Staphylococcus aureus NOT DETECTED NOT DETECTED Final   Streptococcus species NOT DETECTED NOT DETECTED Final   Streptococcus agalactiae NOT DETECTED NOT DETECTED Final   Streptococcus pneumoniae NOT DETECTED NOT DETECTED Final   Streptococcus pyogenes NOT DETECTED NOT DETECTED Final   Acinetobacter baumannii NOT DETECTED NOT DETECTED Final   Enterobacteriaceae species NOT DETECTED NOT DETECTED Final   Enterobacter cloacae  complex NOT DETECTED NOT DETECTED Final   Escherichia coli NOT DETECTED NOT DETECTED Final   Klebsiella oxytoca NOT DETECTED NOT DETECTED Final   Klebsiella pneumoniae NOT DETECTED NOT DETECTED Final   Proteus species NOT DETECTED NOT DETECTED Final   Serratia marcescens NOT DETECTED NOT DETECTED Final   Haemophilus influenzae NOT DETECTED NOT DETECTED Final   Neisseria meningitidis NOT DETECTED NOT DETECTED Final   Pseudomonas aeruginosa NOT DETECTED NOT DETECTED Final   Candida albicans NOT DETECTED NOT DETECTED Final   Candida glabrata NOT DETECTED NOT DETECTED Final   Candida krusei NOT DETECTED NOT DETECTED Final   Candida parapsilosis NOT DETECTED NOT DETECTED Final   Candida tropicalis NOT DETECTED NOT DETECTED Final  Culture, expectorated sputum-assessment     Status: None   Collection Time: 12/11/16  6:14 AM  Result Value Ref Range Status   Specimen Description EXPECTORATED SPUTUM  Final   Special Requests NONE  Final   Sputum evaluation   Final    Sputum specimen not acceptable for testing.  Please recollect.   RESULT CALLED TO, READ BACK BY AND VERIFIED WITH: Madelin Rear RN 11:30 12/14/16 ([email or web address])    Report Status 12/14/2016 FINAL  Final  Gram stain     Status: None   Collection Time: 12/12/16  4:16 PM  Result Value Ref Range Status   Specimen  Description PLEURAL LEFT  Final   Special Requests NONE  Final   Gram Stain   Final    FEW WBC PRESENT, PREDOMINANTLY MONONUCLEAR NO ORGANISMS SEEN    Report Status 12/13/2016 FINAL  Final  Culture, body fluid-bottle     Status: None   Collection Time: 12/12/16  4:16 PM  Result Value Ref Range Status   Specimen Description PLEURAL LEFT  Final   Special Requests BOTTLES DRAWN AEROBIC AND ANAEROBIC  Final   Culture NO GROWTH 5 DAYS  Final   Report Status 12/17/2016 FINAL  Final  Culture, blood (Routine X 2) w Reflex to ID Panel     Status: None   Collection Time: 12/13/16  4:42 PM  Result Value Ref Range Status   Specimen Description BLOOD LEFT HAND  Final   Special Requests IN PEDIATRIC BOTTLE Blood Culture adequate volume  Final   Culture NO GROWTH 5 DAYS  Final   Report Status 12/18/2016 FINAL  Final  Culture, blood (Routine X 2) w Reflex to ID Panel     Status: None   Collection Time: 12/13/16  4:47 PM  Result Value Ref Range Status   Specimen Description BLOOD LEFT HAND  Final   Special Requests IN PEDIATRIC BOTTLE Blood Culture adequate volume  Final   Culture NO GROWTH 5 DAYS  Final   Report Status 12/18/2016 FINAL  Final  Surgical pcr screen     Status: None   Collection Time: 12/14/16  7:00 PM  Result Value Ref Range Status   MRSA, PCR NEGATIVE NEGATIVE Final   Staphylococcus aureus NEGATIVE NEGATIVE Final    Comment:        The Xpert SA Assay (FDA approved for NASAL specimens in patients over 30 years of age), is one component of a comprehensive surveillance program.  Test performance has been validated by [facility] for patients greater than or equal to 29 year old. It is not intended to diagnose infection nor to guide or monitor treatment.   Culture, expectorated sputum-assessment     Status: None   Collection Time: 12/15/16  2:23 AM  Result Value  Ref Range Status   Specimen Description Expect. Sput  Final   Special Requests NONE  Final   Sputum  evaluation   Final    Sputum specimen not acceptable for testing.  Please recollect.   Gram Stain Report Called to,Read Back By and Verified With: SLeonides Schanz RN, AT [phone] 12/15/16 BY Rush Landmark    Report Status 12/15/2016 FINAL  Final    Time coordinating discharge: Approximately 40 minutes  Vance Gather, MD  [facility] 12/19/2016, 2:12 PM Pager [phone]

## 2016-12-19 NOTE — Progress Notes (Signed)
PT Cancellation Note  Patient Details Name: TESHARA MOREE MRN: [id number] DOB: [birth year]   Cancelled Treatment:    Reason Eval/Treat Not Completed: Fatigue/lethargy limiting ability to participate (pt sleeping and requested to not be disturbed, per RN. Will follow. )   Philomena Doheny 12/19/2016, 1:56 PM [phone]

## 2016-12-20 ENCOUNTER — Other Ambulatory Visit: Payer: Self-pay | Admitting: Oncology

## 2016-12-20 ENCOUNTER — Ambulatory Visit: Payer: Medicare Other

## 2016-12-21 ENCOUNTER — Ambulatory Visit (HOSPITAL_BASED_OUTPATIENT_CLINIC_OR_DEPARTMENT_OTHER): Payer: Medicare Other

## 2016-12-21 ENCOUNTER — Telehealth: Payer: Self-pay | Admitting: *Deleted

## 2016-12-21 VITALS — BP 146/62 | HR 92 | Temp 98.6°F | Resp 20

## 2016-12-21 DIAGNOSIS — C3490 Malignant neoplasm of unspecified part of unspecified bronchus or lung: Secondary | ICD-10-CM

## 2016-12-21 DIAGNOSIS — C3492 Malignant neoplasm of unspecified part of left bronchus or lung: Secondary | ICD-10-CM

## 2016-12-21 DIAGNOSIS — Z5189 Encounter for other specified aftercare: Secondary | ICD-10-CM

## 2016-12-21 MED ORDER — PEGFILGRASTIM INJECTION 6 MG/0.6ML ~~LOC~~
6.0000 mg | PREFILLED_SYRINGE | Freq: Once | SUBCUTANEOUS | Status: AC
  Administered 2016-12-21: 6 mg via SUBCUTANEOUS
  Filled 2016-12-21: qty 0.6

## 2016-12-21 NOTE — Patient Instructions (Signed)
Pegfilgrastim injection What is this medicine? PEGFILGRASTIM (PEG fil gra stim) is a long-acting granulocyte colony-stimulating factor that stimulates the growth of neutrophils, a type of white blood cell important in the body's fight against infection. It is used to reduce the incidence of fever and infection in patients with certain types of cancer who are receiving chemotherapy that affects the bone marrow, and to increase survival after being exposed to high doses of radiation. This medicine may be used for other purposes; ask your health care provider or pharmacist if you have questions. COMMON BRAND NAME(S): Neulasta What should I tell my health care provider before I take this medicine? They need to know if you have any of these conditions: -kidney disease -latex allergy -ongoing radiation therapy -sickle cell disease -skin reactions to acrylic adhesives (On-Body Injector only) -an unusual or allergic reaction to pegfilgrastim, filgrastim, other medicines, foods, dyes, or preservatives -pregnant or trying to get pregnant -breast-feeding How should I use this medicine? This medicine is for injection under the skin. If you get this medicine at home, you will be taught how to prepare and give the pre-filled syringe or how to use the On-body Injector. Refer to the patient Instructions for Use for detailed instructions. Use exactly as directed. Tell your healthcare provider immediately if you suspect that the On-body Injector may not have performed as intended or if you suspect the use of the On-body Injector resulted in a missed or partial dose. It is important that you put your used needles and syringes in a special sharps container. Do not put them in a trash can. If you do not have a sharps container, call your pharmacist or healthcare provider to get one. Talk to your pediatrician regarding the use of this medicine in children. While this drug may be prescribed for selected conditions,  precautions do apply. Overdosage: If you think you have taken too much of this medicine contact a poison control center or emergency room at once. NOTE: This medicine is only for you. Do not share this medicine with others. What if I miss a dose? It is important not to miss your dose. Call your doctor or health care professional if you miss your dose. If you miss a dose due to an On-body Injector failure or leakage, a new dose should be administered as soon as possible using a single prefilled syringe for manual use. What may interact with this medicine? Interactions have not been studied. Give your health care provider a list of all the medicines, herbs, non-prescription drugs, or dietary supplements you use. Also tell them if you smoke, drink alcohol, or use illegal drugs. Some items may interact with your medicine. This list may not describe all possible interactions. Give your health care provider a list of all the medicines, herbs, non-prescription drugs, or dietary supplements you use. Also tell them if you smoke, drink alcohol, or use illegal drugs. Some items may interact with your medicine. What should I watch for while using this medicine? You may need blood work done while you are taking this medicine. If you are going to need a MRI, CT scan, or other procedure, tell your doctor that you are using this medicine (On-Body Injector only). What side effects may I notice from receiving this medicine? Side effects that you should report to your doctor or health care professional as soon as possible: -allergic reactions like skin rash, itching or hives, swelling of the face, lips, or tongue -dizziness -fever -pain, redness, or irritation at site   where injected -pinpoint red spots on the skin -red or dark-brown urine -shortness of breath or breathing problems -stomach or side pain, or pain at the shoulder -swelling -tiredness -trouble passing urine or change in the amount of urine Side  effects that usually do not require medical attention (report to your doctor or health care professional if they continue or are bothersome): -bone pain -muscle pain This list may not describe all possible side effects. Call your doctor for medical advice about side effects. You may report side effects to FDA at 1-800-FDA-1088. Where should I keep my medicine? Keep out of the reach of children. Store pre-filled syringes in a refrigerator between 2 and 8 degrees C (36 and 46 degrees F). Do not freeze. Keep in carton to protect from light. Throw away this medicine if it is left out of the refrigerator for more than 48 hours. Throw away any unused medicine after the expiration date. NOTE: This sheet is a summary. It may not cover all possible information. If you have questions about this medicine, talk to your doctor, pharmacist, or health care provider.  2018 Elsevier/Gold Standard (2016-06-09 12:58:03)  

## 2016-12-21 NOTE — Telephone Encounter (Signed)
Message from [facility] reporting pt would not make injection appointment on 6/26.  Returned call, left message informing her it is imperative that pt receive injection today. No return call from rehab facility.  1330: Called back, spoke with Fraser Din. She will speak with transportation and call back. Per Fraser Din, transportation will be able to bring pt.

## 2016-12-27 ENCOUNTER — Encounter (HOSPITAL_COMMUNITY): Payer: Self-pay | Admitting: Emergency Medicine

## 2016-12-27 ENCOUNTER — Emergency Department (HOSPITAL_COMMUNITY): Payer: Medicare Other

## 2016-12-27 ENCOUNTER — Emergency Department (HOSPITAL_COMMUNITY)
Admission: EM | Admit: 2016-12-27 | Discharge: 2016-12-27 | Disposition: A | Discharge status: Home / Self Care | Payer: Medicare Other | Attending: Emergency Medicine | Admitting: Emergency Medicine

## 2016-12-27 DIAGNOSIS — R799 Abnormal finding of blood chemistry, unspecified: Secondary | ICD-10-CM | POA: Diagnosis present

## 2016-12-27 DIAGNOSIS — D696 Thrombocytopenia, unspecified: Secondary | ICD-10-CM | POA: Diagnosis not present

## 2016-12-27 DIAGNOSIS — C3492 Malignant neoplasm of unspecified part of left bronchus or lung: Secondary | ICD-10-CM

## 2016-12-27 DIAGNOSIS — Z87891 Personal history of nicotine dependence: Secondary | ICD-10-CM | POA: Diagnosis not present

## 2016-12-27 DIAGNOSIS — D72819 Decreased white blood cell count, unspecified: Secondary | ICD-10-CM

## 2016-12-27 DIAGNOSIS — T451X5A Adverse effect of antineoplastic and immunosuppressive drugs, initial encounter: Secondary | ICD-10-CM | POA: Insufficient documentation

## 2016-12-27 DIAGNOSIS — R112 Nausea with vomiting, unspecified: Secondary | ICD-10-CM | POA: Insufficient documentation

## 2016-12-27 DIAGNOSIS — IMO0001 Reserved for inherently not codable concepts without codable children: Secondary | ICD-10-CM

## 2016-12-27 HISTORY — DX: Malignant (primary) neoplasm, unspecified: C80.1

## 2016-12-27 LAB — CBC WITH DIFFERENTIAL/PLATELET
BASOS ABS: 0 10*3/uL (ref 0.0–0.1)
BASOS PCT: 2 %
EOS ABS: 0 10*3/uL (ref 0.0–0.7)
Eosinophils Relative: 3 %
HCT: 31.9 % — ABNORMAL LOW (ref 36.0–46.0)
Hemoglobin: 10.6 g/dL — ABNORMAL LOW (ref 12.0–15.0)
Lymphocytes Relative: 85 %
Lymphs Abs: 0.7 10*3/uL (ref 0.7–4.0)
MCH: 26.5 pg (ref 26.0–34.0)
MCHC: 33.2 g/dL (ref 30.0–36.0)
MCV: 79.8 fL (ref 78.0–100.0)
MONO ABS: 0 10*3/uL — AB (ref 0.1–1.0)
Monocytes Relative: 7 %
NEUTROS ABS: 0 10*3/uL — AB (ref 1.7–7.7)
NEUTROS PCT: 3 %
PLATELETS: 65 10*3/uL — AB (ref 150–400)
RBC: 4 MIL/uL (ref 3.87–5.11)
RDW: 14.2 % (ref 11.5–15.5)
WBC: 0.7 10*3/uL — CL (ref 4.0–10.5)

## 2016-12-27 LAB — COMPREHENSIVE METABOLIC PANEL
ALK PHOS: 71 U/L (ref 38–126)
ALT: 16 U/L (ref 14–54)
AST: 24 U/L (ref 15–41)
Albumin: 3.5 g/dL (ref 3.5–5.0)
Anion gap: 9 (ref 5–15)
BILIRUBIN TOTAL: 0.6 mg/dL (ref 0.3–1.2)
BUN: <5 mg/dL — ABNORMAL LOW (ref 6–20)
CALCIUM: 9.2 mg/dL (ref 8.9–10.3)
CO2: 32 mmol/L (ref 22–32)
CREATININE: 0.69 mg/dL (ref 0.44–1.00)
Chloride: 90 mmol/L — ABNORMAL LOW (ref 101–111)
GFR calc non Af Amer: >60 mL/min (ref >60)
GLUCOSE: 97 mg/dL (ref 65–99)
Potassium: 2.9 mmol/L — ABNORMAL LOW (ref 3.5–5.1)
SODIUM: 131 mmol/L — AB (ref 135–145)
TOTAL PROTEIN: 7.2 g/dL (ref 6.5–8.1)

## 2016-12-27 LAB — I-STAT CG4 LACTIC ACID, ED: Lactic Acid, Venous: 1.6 mmol/L (ref 0.5–1.9)

## 2016-12-27 LAB — URIC ACID: URIC ACID, SERUM: 3.6 mg/dL (ref 2.3–6.6)

## 2016-12-27 MED ORDER — CIPROFLOXACIN HCL 500 MG PO TABS: 500.0000 mg | ORAL_TABLET | Freq: Two times a day (BID) | ORAL | 0 refills | Status: DC

## 2016-12-27 MED ORDER — SODIUM CHLORIDE 0.9 % IV BOLUS (SEPSIS)
500.0000 mL | Freq: Once | INTRAVENOUS | Status: AC
  Administered 2016-12-27: 500 mL via INTRAVENOUS

## 2016-12-27 MED ORDER — CIPROFLOXACIN HCL 500 MG PO TABS
500.0000 mg | ORAL_TABLET | Freq: Once | ORAL | Status: AC
  Administered 2016-12-27: 500 mg via ORAL
  Filled 2016-12-27: qty 1

## 2016-12-27 MED ORDER — POTASSIUM CHLORIDE ER 20 MEQ PO TBCR: 20.0000 meq | EXTENDED_RELEASE_TABLET | Freq: Every day | ORAL | 0 refills | Status: AC

## 2016-12-27 MED ORDER — POTASSIUM CHLORIDE CRYS ER 20 MEQ PO TBCR
40.0000 meq | EXTENDED_RELEASE_TABLET | Freq: Once | ORAL | Status: AC
  Administered 2016-12-27: 40 meq via ORAL
  Filled 2016-12-27: qty 2

## 2016-12-27 MED ORDER — ENSURE COMPLETE PO LIQD: 237.0000 mL | Freq: Two times a day (BID) | ORAL | 0 refills | Status: AC

## 2016-12-27 NOTE — ED Provider Notes (Signed)
[facility] DEPT Provider Note   CSN: [id number] Arrival date & time: 12/27/16  1239     History   Chief Complaint Chief Complaint  Patient presents with  . Abnormal Lab    HPI Amanda Macias is a 71 y.o. female.  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  Was sent from SNF today for abnormal lab values.  WBC 0.6, plat 53.  Per discharge pt was to get neulasta in interim between treatment.  Pt states she has no appetite.  Had N/V x 1 today.  She states she is not eating because "everything tastes the same."  Denies any pain anywhere.  Denies worsening SOB, any urinary symptoms, any diarrhea.     Past Medical History:  Diagnosis Date  . Cancer ([place])   . Hypokalemia 11/2016  . Mass of left lung 11/2016  . UTI (urinary tract infection) 11/2016    Patient Active Problem List   Diagnosis Date Noted  . Bacteremia due to Enterobacter species 12/19/2016  . Small cell lung cancer ([place]) 12/16/2016  . Cough 12/11/2016  . Lung mass 12/11/2016  . Hypokalemia 12/11/2016  . Sepsis ([place]) 12/11/2016  . UTI (urinary tract infection) 12/11/2016  . Mass of left lung   . Pleural effusion     Past Surgical History:  Procedure Laterality Date  . CHOLECYSTECTOMY    . IR THORACENTESIS ASP PLEURAL SPACE W/IMG GUIDE  12/12/2016  . VIDEO BRONCHOSCOPY WITH ENDOBRONCHIAL ULTRASOUND N/A 12/15/2016   Procedure: VIDEO BRONCHOSCOPY WITH ENDOBRONCHIAL ULTRASOUND;  Surgeon: Marshell Garfinkel, MD;  Location: [facility];  Service: Pulmonary;  Laterality: N/A;    OB History    No data available       Home Medications    Prior to Admission medications   Medication Sig Start Date End Date Taking? Authorizing Provider  albuterol (PROVENTIL) (2.5 MG/3ML) 0.083% nebulizer solution Take 3 mLs (2.5 mg total) by nebulization every 4 (four) hours as needed for wheezing or shortness  of breath. 12/19/16  Yes Patrecia Pour, MD  sulfamethoxazole-trimethoprim (BACTRIM DS,SEPTRA DS) 800-160 MG tablet Take 1 tablet by mouth 2 (two) times daily. 12/19/16  Yes Patrecia Pour, MD  ciprofloxacin (CIPRO) 500 MG tablet Take 1 tablet (500 mg total) by mouth 2 (two) times daily. 12/27/16   Clayton Bibles, PA-C  feeding supplement, ENSURE COMPLETE, (ENSURE COMPLETE) LIQD Take 237 mLs by mouth 2 (two) times daily between meals. 12/27/16   Clayton Bibles, PA-C  potassium chloride 20 MEQ TBCR Take 20 mEq by mouth daily. 12/27/16   Clayton Bibles, PA-C    Family History Family History  Problem Relation Age of Onset  . Stroke Father     Social History Social History  Substance Use Topics  . Smoking status: Former Research scientist (life sciences)  . Smokeless tobacco: Never Used     Comment: QUIT 15 YEARS AGO   . Alcohol use No     Allergies   No known allergies   Review of Systems Review of Systems  All other systems reviewed and are negative.    Physical Exam Updated Vital Signs BP 126/68 (BP Location: Right Arm)   Pulse 83   Temp 99.4 F (37.4 C) (Rectal)   Resp 18   Ht 5\' 5"  (1.651 m)   Wt 116.6 kg (257 lb)   SpO2 96%   BMI 42.77 kg/m   Physical  Exam  Constitutional: She appears well-developed and well-nourished. No distress.  HENT:  Head: Normocephalic and atraumatic.  Neck: Neck supple.  Cardiovascular: Normal rate and regular rhythm.   Pulmonary/Chest: Effort normal. No accessory muscle usage. No respiratory distress. She has decreased breath sounds. She has no wheezes. She has rhonchi.  On 2L [place]  97%O2  Abdominal: Soft. She exhibits no distension. There is no tenderness. There is no rebound and no guarding.  Neurological: She is alert.  Skin: She is not diaphoretic.  Psychiatric: She is withdrawn.  Nursing note and vitals reviewed.    ED Treatments / Results  Labs (all labs ordered are listed, but only abnormal results are displayed) Labs Reviewed  COMPREHENSIVE METABOLIC PANEL -  Abnormal; Notable for the following:       Result Value   Sodium 131 (*)    Potassium 2.9 (*)    Chloride 90 (*)    BUN <5 (*)    All other components within normal limits  CBC WITH DIFFERENTIAL/PLATELET - Abnormal; Notable for the following:    WBC 0.7 (*)    Hemoglobin 10.6 (*)    HCT 31.9 (*)    Platelets 65 (*)    Neutro Abs 0.0 (*)    Monocytes Absolute 0.0 (*)    All other components within normal limits  URIC ACID  I-STAT CG4 LACTIC ACID, ED    EKG  EKG Interpretation None       Radiology Dg Chest 2 View  Result Date: 12/27/2016 CLINICAL DATA:  Shortness of breath.  Left-sided lung carcinoma EXAM: CHEST  2 VIEW COMPARISON:  December 15, 2016 FINDINGS: There is complete opacification of the left hemithorax, unchanged from prior study. Right lung is clear. Heart is upper normal in size. The pulmonary vascularity on the right is within normal limits. Pulmonary vascularity on the left is obscured. No bone lesions are evident. No adenopathy is apparent in areas that can be assessed for potential adenopathy by radiography. IMPRESSION: Persistent complete opacification on the left. Suspect a combination of consolidation and fluid on the left. Underlying mass cannot be excluded radiographically on the left. Right lung clear. Stable cardiac silhouette. Electronically Signed   By: Lowella Grip III M.D.   On: 12/27/2016 14:39    Procedures Procedures (including critical care time)  Medications Ordered in ED Medications  sodium chloride 0.9 % bolus 500 mL (0 mLs Intravenous Stopped 12/27/16 1610)  potassium chloride SA (K-DUR,KLOR-CON) CR tablet 40 mEq (40 mEq Oral Given 12/27/16 1616)  ciprofloxacin (CIPRO) tablet 500 mg (500 mg Oral Given 12/27/16 1656)     Initial Impression / Assessment and Plan / ED Course  I have reviewed the triage vital signs and the nursing notes.  Pertinent labs & imaging results that were available during my care of the patient were reviewed by me and  considered in my medical decision making (see chart for details).  Clinical Course as of Dec 27 2205  Tue Dec 27, 2016  1525 I discussed pt with Dr Benay Spice.  If no apparent infection, pt can be d/c home on cipro 500mg  BID, will be seen in the clinic on Friday (3 days from now).  Pt DID get her neulasta injection, on 6/27, so this drop in her blood counts is expected, should be improving over the next few days.  Pt to be given strict return precautions for fever, bleeding.    [EW]    Clinical Course User Index [EW] Clayton Bibles, [place]  Pt with newly diagnosed small cell lung CA with chemo starting on 6/22-6/24 p/w leukopenia and thrombocytopenia.  Pt also not eating, has nausea, vomiting today.  I spoke with Dr Benay Spice, see above.  Pt tells me she came in only because of the abnormal labs and because her facility wanted her to come.  States she is not eating much and has nausea but this is likely related to a side effect of the chemotherapy or her disease process.  She has had no vomiting in ED.  Tolerating PO.  Discussed checking UA here, pt denies any symptoms at all and prefers not to run UA.  I agree with this.  Pt d/c home on cipro as directed by Dr Benay Spice.  Oncology follow up in 3 days.  Strict return precautions.  Discussed result, findings, treatment, and follow up  with patient.  Pt given return precautions.  Pt verbalizes understanding and agrees with plan.      Final Clinical Impressions(s) / ED Diagnoses   Final diagnoses:  Malignant neoplasm of left lung, unspecified part of lung (HCC)  Leukopenia, unspecified type  Thrombocytopenia ([place])  Effect of chemotherapy, initial encounter    New Prescriptions Discharge Medication List as of 12/27/2016  4:37 PM    START taking these medications   Details  ciprofloxacin (CIPRO) 500 MG tablet Take 1 tablet (500 mg total) by mouth 2 (two) times daily., Starting Tue 12/27/2016, Print    feeding supplement, ENSURE COMPLETE, (ENSURE  COMPLETE) LIQD Take 237 mLs by mouth 2 (two) times daily between meals., Starting Tue 12/27/2016, Print    potassium chloride 20 MEQ TBCR Take 20 mEq by mouth daily., Starting Tue 12/27/2016, Print         Clayton Bibles, PA-C 12/27/16 [place], [place], MD 12/28/16 [phone]

## 2016-12-27 NOTE — Discharge Instructions (Signed)
Read the information below.  You may return to the Emergency Department at any time for worsening condition or any new symptoms that concern you.   Take the prescribed antibiotic twice daily to protect your from infection. We gave you your first dose in the Emergency Department.  Start your prescription tomorrow morning.    Please drink plenty of fluids and try to eat a healthy diet.  If you develop fever, chills, or bleeding call your oncologist right away and call 911 and return to the Emergency Department.

## 2016-12-27 NOTE — ED Notes (Signed)
Bed: WA17 Expected date:  Expected time:  Means of arrival:  Comments: EMS-abnormal labs

## 2016-12-27 NOTE — ED Triage Notes (Addendum)
Per [facility] pt from rehab facility due to abnormal labs with blood work this morning. Lab work brought in with pt states WBC0.6 and platelet count of 53. Pt denies any fever or symptoms. Pt on 2L  continuously. Reports last chemo within past week. Pt dry heaving during triage.

## 2016-12-27 NOTE — ED Notes (Signed)
Pt. Was unsuccessful with urine specimen. Will collect urine when pt. Voids. Nurses aware.

## 2016-12-27 NOTE — ED Notes (Signed)
Neutropenic (protective) precautions in place.

## 2016-12-30 ENCOUNTER — Ambulatory Visit (HOSPITAL_BASED_OUTPATIENT_CLINIC_OR_DEPARTMENT_OTHER): Payer: Medicare Other | Admitting: Oncology

## 2016-12-30 ENCOUNTER — Other Ambulatory Visit (HOSPITAL_BASED_OUTPATIENT_CLINIC_OR_DEPARTMENT_OTHER): Payer: Medicare Other

## 2016-12-30 ENCOUNTER — Telehealth: Payer: Self-pay | Admitting: Oncology

## 2016-12-30 VITALS — BP 125/59 | HR 88 | Temp 98.4°F | Resp 17 | Ht 65.0 in

## 2016-12-30 DIAGNOSIS — C3492 Malignant neoplasm of unspecified part of left bronchus or lung: Secondary | ICD-10-CM

## 2016-12-30 DIAGNOSIS — C3490 Malignant neoplasm of unspecified part of unspecified bronchus or lung: Secondary | ICD-10-CM

## 2016-12-30 LAB — CBC WITH DIFFERENTIAL/PLATELET
BASO%: 0.6 % (ref 0.0–2.0)
Basophils Absolute: 0 10*3/uL (ref 0.0–0.1)
EOS%: 0.5 % (ref 0.0–7.0)
Eosinophils Absolute: 0 10*3/uL (ref 0.0–0.5)
HEMATOCRIT: 35.5 % (ref 34.8–46.6)
HEMOGLOBIN: 11.4 g/dL — AB (ref 11.6–15.9)
LYMPH#: 2.8 10*3/uL (ref 0.9–3.3)
LYMPH%: 38.2 % (ref 14.0–49.7)
MCH: 26.3 pg (ref 25.1–34.0)
MCHC: 32.1 g/dL (ref 31.5–36.0)
MCV: 82.1 fL (ref 79.5–101.0)
MONO#: 0.4 10*3/uL (ref 0.1–0.9)
MONO%: 5.6 % (ref 0.0–14.0)
NEUT%: 55.1 % (ref 38.4–76.8)
NEUTROS ABS: 4.1 10*3/uL (ref 1.5–6.5)
Platelets: 82 10*3/uL — ABNORMAL LOW (ref 145–400)
RBC: 4.32 10*6/uL (ref 3.70–5.45)
RDW: 15.3 % — AB (ref 11.2–14.5)
WBC: 7.5 10*3/uL (ref 3.9–10.3)

## 2016-12-30 NOTE — Progress Notes (Signed)
[facility] OFFICE PROGRESS NOTE   Diagnosis: Small cell lung cancer  INTERVAL HISTORY:   I saw Ms. Heron in the hospital when she was diagnosed with small cell lung cancer. She completed cycle 1 etoposide/carboplatin chemotherapy beginning 12/16/2016. She was discharged to a skilled nursing facility. She received Neulasta on 12/21/2016.  She has noted improvement in dyspnea. A nadir CBC 12/27/2016 returned with a total white count of 0.7 and platelets and 65,000. She was evaluated in the emergency room and placed on prophylactic ciprofloxacin. No fever.  Objective:  Vital signs in last 24 hours:  Blood pressure (!) 125/59, pulse 88, temperature 98.4 F (36.9 C), temperature source Oral, resp. rate 17, height 5\' 5"  (1.651 m), SpO2 91 %.    HEENT: No thrush or ulcers Resp: Mild bilateral wheezing, decreased breath sounds at the left lower chest, no respiratory distress Cardio: Regular rate and rhythm GI: No hepatomegaly, nontender Vascular: No leg edema   Portacath/PICC-without erythema  Lab Results:  Lab Results  Component Value Date   WBC 7.5 12/30/2016   HGB 11.4 (L) 12/30/2016   HCT 35.5 12/30/2016   MCV 82.1 12/30/2016   PLT 82 (L) 12/30/2016   NEUTROABS 4.1 12/30/2016    CMP     Component Value Date/Time   NA 131 (L) 12/27/2016 1309   K 2.9 (L) 12/27/2016 1309   CL 90 (L) 12/27/2016 1309   CO2 32 12/27/2016 1309   GLUCOSE 97 12/27/2016 1309   BUN <5 (L) 12/27/2016 1309   CREATININE 0.69 12/27/2016 1309   CALCIUM 9.2 12/27/2016 1309   PROT 7.2 12/27/2016 1309   ALBUMIN 3.5 12/27/2016 1309   AST 24 12/27/2016 1309   ALT 16 12/27/2016 1309   ALKPHOS 71 12/27/2016 1309   BILITOT 0.6 12/27/2016 1309   GFRNONAA >60 12/27/2016 1309   GFRAA >60 12/27/2016 1309     Imaging:  Dg Chest 2 View  Result Date: 12/27/2016 CLINICAL DATA:  Shortness of breath.  Left-sided lung carcinoma EXAM: CHEST  2 VIEW COMPARISON:  December 15, 2016 FINDINGS: There  is complete opacification of the left hemithorax, unchanged from prior study. Right lung is clear. Heart is upper normal in size. The pulmonary vascularity on the right is within normal limits. Pulmonary vascularity on the left is obscured. No bone lesions are evident. No adenopathy is apparent in areas that can be assessed for potential adenopathy by radiography. IMPRESSION: Persistent complete opacification on the left. Suspect a combination of consolidation and fluid on the left. Underlying mass cannot be excluded radiographically on the left. Right lung clear. Stable cardiac silhouette. Electronically Signed   By: Lowella Grip III M.D.   On: 12/27/2016 14:39    Medications: I have reviewed the patient's current medications.  Assessment/Plan:  1. Small cell lung cancer-Left lung/mediastinal mass with a large left pleural effusion  -negative pleural fluid cytology 12/12/2016  Bronchoscopy/EBUSon 12/15/2016-extrinsic compression of the left mainstem bronchus with distal endobronchial tumor, status post endobronchial and level 7 node biopsies-pathology consistent with small cell lung cancer  Left neck and left axillary lymph nodes on physical exam 12/15/2016  Bone scan 12/16/2016-no evidence of bone metastases  CT abdomen/pelvis 12/16/2016-no evidence of metastatic disease  Cycle 1 etoposide/carboplatin 12/16/2016, Neulasta 12/21/2016  2. 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 Dyspnea/cough secondary to #1  4. Escherichia coli urinary tract infection and Enterobacter bacteremia/bacillus species on blood cultures 12/10/2016, no evidence of ongoing infection, source for bacteremia unclear  5.  Neutropenia/thrombocytopenia following cycle 1 etoposide/carboplatin-improved    Disposition:  She is now at day 15 following cycle 1 etoposide/carboplatin given for treatment of small cell lung cancer. Her clinical status has improved following the cycle  of chemotherapy. The white count and platelets are recovering. She will stop prophylactic ciprofloxacin.  Ms. Barritt will return for an office visit and cycle 2 etoposide/carboplatin 01/16/2017. She will receive Neulasta support following chemotherapy.  She will be referred for a restaging chest CT after cycle 2.  25 minutes were spent with the patient today. The majority of the time was used for counseling and coordination of care.  Donneta Romberg, MD  12/30/2016  3:29 PM

## 2016-12-30 NOTE — Telephone Encounter (Signed)
Scheduled appt per 7/6 los - Gave patient AVS and calender per los.  

## 2017-01-11 ENCOUNTER — Encounter: Payer: Self-pay | Admitting: Pharmacist

## 2017-01-14 ENCOUNTER — Other Ambulatory Visit: Payer: Self-pay | Admitting: Oncology

## 2017-01-16 ENCOUNTER — Ambulatory Visit (HOSPITAL_BASED_OUTPATIENT_CLINIC_OR_DEPARTMENT_OTHER): Payer: Medicare Other

## 2017-01-16 ENCOUNTER — Other Ambulatory Visit (HOSPITAL_BASED_OUTPATIENT_CLINIC_OR_DEPARTMENT_OTHER): Payer: Medicare Other

## 2017-01-16 ENCOUNTER — Ambulatory Visit (HOSPITAL_BASED_OUTPATIENT_CLINIC_OR_DEPARTMENT_OTHER): Payer: Medicare Other | Admitting: Oncology

## 2017-01-16 VITALS — BP 118/76 | HR 111 | Temp 98.7°F | Resp 16

## 2017-01-16 VITALS — Wt 216.1 lb

## 2017-01-16 DIAGNOSIS — R634 Abnormal weight loss: Secondary | ICD-10-CM | POA: Diagnosis not present

## 2017-01-16 DIAGNOSIS — C3492 Malignant neoplasm of unspecified part of left bronchus or lung: Secondary | ICD-10-CM

## 2017-01-16 DIAGNOSIS — C3490 Malignant neoplasm of unspecified part of unspecified bronchus or lung: Secondary | ICD-10-CM

## 2017-01-16 DIAGNOSIS — R05 Cough: Secondary | ICD-10-CM | POA: Diagnosis not present

## 2017-01-16 DIAGNOSIS — R490 Dysphonia: Secondary | ICD-10-CM | POA: Diagnosis not present

## 2017-01-16 DIAGNOSIS — R0600 Dyspnea, unspecified: Secondary | ICD-10-CM | POA: Diagnosis not present

## 2017-01-16 DIAGNOSIS — I872 Venous insufficiency (chronic) (peripheral): Secondary | ICD-10-CM | POA: Diagnosis not present

## 2017-01-16 DIAGNOSIS — Z5111 Encounter for antineoplastic chemotherapy: Secondary | ICD-10-CM | POA: Diagnosis present

## 2017-01-16 LAB — COMPREHENSIVE METABOLIC PANEL
ALBUMIN: 3.2 g/dL — AB (ref 3.5–5.0)
ALK PHOS: 99 U/L (ref 40–150)
ALT: 9 U/L (ref 0–55)
AST: 23 U/L (ref 5–34)
Anion Gap: 15 mEq/L — ABNORMAL HIGH (ref 3–11)
BUN: 10.9 mg/dL (ref 7.0–26.0)
CALCIUM: 10.4 mg/dL (ref 8.4–10.4)
CO2: 24 mEq/L (ref 22–29)
CREATININE: 0.8 mg/dL (ref 0.6–1.1)
Chloride: 100 mEq/L (ref 98–109)
EGFR: 87 mL/min/{1.73_m2} — ABNORMAL LOW (ref >90)
Glucose: 96 mg/dl (ref 70–140)
Potassium: 3.9 mEq/L (ref 3.5–5.1)
Sodium: 139 mEq/L (ref 136–145)
Total Bilirubin: 0.7 mg/dL (ref 0.20–1.20)
Total Protein: 7.6 g/dL (ref 6.4–8.3)

## 2017-01-16 LAB — CBC WITH DIFFERENTIAL/PLATELET
BASO%: 0.6 % (ref 0.0–2.0)
BASOS ABS: 0.1 10*3/uL (ref 0.0–0.1)
EOS%: 0.9 % (ref 0.0–7.0)
Eosinophils Absolute: 0.1 10*3/uL (ref 0.0–0.5)
HEMATOCRIT: 34.7 % — AB (ref 34.8–46.6)
HEMOGLOBIN: 11.2 g/dL — AB (ref 11.6–15.9)
LYMPH%: 24.3 % (ref 14.0–49.7)
MCH: 26.1 pg (ref 25.1–34.0)
MCHC: 32.4 g/dL (ref 31.5–36.0)
MCV: 80.6 fL (ref 79.5–101.0)
MONO#: 0.9 10*3/uL (ref 0.1–0.9)
MONO%: 8.2 % (ref 0.0–14.0)
NEUT#: 7.4 10*3/uL — ABNORMAL HIGH (ref 1.5–6.5)
NEUT%: 66 % (ref 38.4–76.8)
PLATELETS: 741 10*3/uL — AB (ref 145–400)
RBC: 4.3 10*6/uL (ref 3.70–5.45)
RDW: 15.8 % — AB (ref 11.2–14.5)
WBC: 11.3 10*3/uL — ABNORMAL HIGH (ref 3.9–10.3)
lymph#: 2.7 10*3/uL (ref 0.9–3.3)

## 2017-01-16 MED ORDER — PALONOSETRON HCL INJECTION 0.25 MG/5ML
0.2500 mg | Freq: Once | INTRAVENOUS | Status: AC
  Administered 2017-01-16: 0.25 mg via INTRAVENOUS

## 2017-01-16 MED ORDER — PALONOSETRON HCL INJECTION 0.25 MG/5ML
INTRAVENOUS | Status: AC
  Filled 2017-01-16: qty 20

## 2017-01-16 MED ORDER — DEXAMETHASONE SODIUM PHOSPHATE 10 MG/ML IJ SOLN
INTRAMUSCULAR | Status: AC
  Filled 2017-01-16: qty 1

## 2017-01-16 MED ORDER — SODIUM CHLORIDE 0.9 % IV SOLN
Freq: Once | INTRAVENOUS | Status: AC
  Administered 2017-01-16: 11:00:00 via INTRAVENOUS

## 2017-01-16 MED ORDER — SODIUM CHLORIDE 0.9 % IV SOLN
80.0000 mg/m2 | Freq: Once | INTRAVENOUS | Status: AC
  Administered 2017-01-16: 180 mg via INTRAVENOUS
  Filled 2017-01-16: qty 9

## 2017-01-16 MED ORDER — DEXAMETHASONE SODIUM PHOSPHATE 10 MG/ML IJ SOLN
10.0000 mg | Freq: Once | INTRAMUSCULAR | Status: AC
Start: 2017-01-16 — End: 2017-01-16
  Administered 2017-01-16: 10 mg via INTRAVENOUS

## 2017-01-16 MED ORDER — SODIUM CHLORIDE 0.9 % IV SOLN
430.0000 mg | Freq: Once | INTRAVENOUS | Status: AC
  Administered 2017-01-16: 430 mg via INTRAVENOUS
  Filled 2017-01-16: qty 43

## 2017-01-16 NOTE — Progress Notes (Signed)
  [facility] OFFICE PROGRESS NOTE   Diagnosis: Small cell lung cancer  INTERVAL HISTORY:   Amanda Macias returns as scheduled. She continues to live in the skilled nursing facility. She relates weight loss to not liking the food there. She developed hoarseness a few weeks ago. No change in dyspnea. No pain.  Objective:  Vital signs in last 24 hours:  Weight 216 lb 1.6 oz (98 kg).    HEENT: No thrush or ulcers Resp: Decreased breath sounds at the left upper anterior chest, no respiratory distress Cardio: Regular rate and rhythm GI: No hepatomegaly, nontender Vascular: No leg edema Neuro: Alert, follows commands, ambulates to the examination table       Lab Results:  Lab Results  Component Value Date   WBC 11.3 (H) 01/16/2017   HGB 11.2 (L) 01/16/2017   HCT 34.7 (L) 01/16/2017   MCV 80.6 01/16/2017   PLT 741 (H) 01/16/2017   NEUTROABS 7.4 (H) 01/16/2017    CMP     Component Value Date/Time   NA 139 01/16/2017 0819   K 3.9 01/16/2017 0819   CL 90 (L) 12/27/2016 1309   CO2 24 01/16/2017 0819   GLUCOSE 96 01/16/2017 0819   BUN 10.9 01/16/2017 0819   CREATININE 0.8 01/16/2017 0819   CALCIUM 10.4 01/16/2017 0819   PROT 7.6 01/16/2017 0819   ALBUMIN 3.2 (L) 01/16/2017 0819   AST 23 01/16/2017 0819   ALT 9 01/16/2017 0819   ALKPHOS 99 01/16/2017 0819   BILITOT 0.70 01/16/2017 0819   GFRNONAA >60 12/27/2016 1309   GFRAA >60 12/27/2016 1309     Medications: I have reviewed the patient's current medications.  Assessment/Plan: 1. Small cell lung cancer-Left lung/mediastinal mass with a large left pleural effusion  -negative pleural fluid cytology 12/12/2016  Bronchoscopy/EBUSon 12/15/2016-extrinsic compression of the left mainstem bronchus with distal endobronchial tumor, status post endobronchial and level 7 node biopsies-pathology consistent with small cell lung cancer  Left neck and left axillary lymph nodes on physical exam 12/15/2016  Bone  scan 12/16/2016-no evidence of bone metastases  CT abdomen/pelvis 12/16/2016-no evidence of metastatic disease  Cycle 1 etoposide/carboplatin 12/16/2016, Neulasta 12/21/2016  Cycle 2 etoposide/carboplatin 01/16/2017, Onpro day 3  2. 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 Dyspnea/cough secondary to #1  4. Escherichia coli urinary tract infection and Enterobacter bacteremia/bacillus species on blood cultures 12/10/2016, no evidence of ongoing infection, source for bacteremia unclear  5. Neutropenia/thrombocytopenia following cycle 1 etoposide/carboplatin-resolved   Disposition:  [name] appears unchanged. The plan is to proceed with cycle 2 etoposide/carboplatin today. She has lost a significant amount of weight. Her weight was repeated today and chemotherapy was dose adjusted.  She will return for a nadir CBC 01/27/2017. She will be scheduled for a restaging chest CT and office visit on 02/07/2017.  20 minutes were spent with the patient today. The majority of the time was used for counseling and coordination of care.  Donneta Romberg, MD  01/16/2017  6:17 PM

## 2017-01-16 NOTE — Patient Instructions (Signed)
[facility] Discharge Instructions for Patients Receiving Chemotherapy  Today you received the following chemotherapy agents:  Etoposide and Carboplatin.  To help prevent nausea and vomiting after your treatment, we encourage you to take your nausea medication as directed.   If you develop nausea and vomiting that is not controlled by your nausea medication, call the clinic.   BELOW ARE SYMPTOMS THAT SHOULD BE REPORTED IMMEDIATELY:  *FEVER GREATER THAN 100.5 F  *CHILLS WITH OR WITHOUT FEVER  NAUSEA AND VOMITING THAT IS NOT CONTROLLED WITH YOUR NAUSEA MEDICATION  *UNUSUAL SHORTNESS OF BREATH  *UNUSUAL BRUISING OR BLEEDING  TENDERNESS IN MOUTH AND THROAT WITH OR WITHOUT PRESENCE OF ULCERS  *URINARY PROBLEMS  *BOWEL PROBLEMS  UNUSUAL RASH Items with * indicate a potential emergency and should be followed up as soon as possible.  Feel free to call the clinic you have any questions or concerns. The clinic phone number is [phone].  Please show the [facility] at check-in to the Emergency Department and triage nurse.

## 2017-01-17 ENCOUNTER — Ambulatory Visit (HOSPITAL_BASED_OUTPATIENT_CLINIC_OR_DEPARTMENT_OTHER): Payer: Medicare Other

## 2017-01-17 ENCOUNTER — Telehealth: Payer: Self-pay | Admitting: Oncology

## 2017-01-17 VITALS — BP 124/64 | HR 98 | Temp 98.5°F | Resp 17

## 2017-01-17 DIAGNOSIS — Z5111 Encounter for antineoplastic chemotherapy: Secondary | ICD-10-CM

## 2017-01-17 DIAGNOSIS — C3492 Malignant neoplasm of unspecified part of left bronchus or lung: Secondary | ICD-10-CM

## 2017-01-17 DIAGNOSIS — C3490 Malignant neoplasm of unspecified part of unspecified bronchus or lung: Secondary | ICD-10-CM

## 2017-01-17 MED ORDER — SODIUM CHLORIDE 0.9 % IV SOLN
180.0000 mg | Freq: Once | INTRAVENOUS | Status: AC
  Administered 2017-01-17: 180 mg via INTRAVENOUS
  Filled 2017-01-17: qty 9

## 2017-01-17 MED ORDER — DEXAMETHASONE SODIUM PHOSPHATE 10 MG/ML IJ SOLN
INTRAMUSCULAR | Status: AC
  Filled 2017-01-17: qty 1

## 2017-01-17 MED ORDER — DEXAMETHASONE SODIUM PHOSPHATE 10 MG/ML IJ SOLN
10.0000 mg | Freq: Once | INTRAMUSCULAR | Status: AC
  Administered 2017-01-17: 10 mg via INTRAVENOUS

## 2017-01-17 MED ORDER — SODIUM CHLORIDE 0.9 % IV SOLN
Freq: Once | INTRAVENOUS | Status: AC
  Administered 2017-01-17: 09:00:00 via INTRAVENOUS

## 2017-01-17 NOTE — Telephone Encounter (Signed)
Not able to reach patient re August appointments. Schedule mailed and comment added to 7/25 appointment to send patient for new schedule.

## 2017-01-17 NOTE — Patient Instructions (Signed)
[facility] Discharge Instructions for Patients Receiving Chemotherapy  Today you received the following chemotherapy agents: Etoposide   To help prevent nausea and vomiting after your treatment, we encourage you to take your nausea medication as directed.    If you develop nausea and vomiting that is not controlled by your nausea medication, call the clinic.   BELOW ARE SYMPTOMS THAT SHOULD BE REPORTED IMMEDIATELY:  *FEVER GREATER THAN 100.5 F  *CHILLS WITH OR WITHOUT FEVER  NAUSEA AND VOMITING THAT IS NOT CONTROLLED WITH YOUR NAUSEA MEDICATION  *UNUSUAL SHORTNESS OF BREATH  *UNUSUAL BRUISING OR BLEEDING  TENDERNESS IN MOUTH AND THROAT WITH OR WITHOUT PRESENCE OF ULCERS  *URINARY PROBLEMS  *BOWEL PROBLEMS  UNUSUAL RASH Items with * indicate a potential emergency and should be followed up as soon as possible.  Feel free to call the clinic you have any questions or concerns. The clinic phone number is [phone].  Please show the [facility] at check-in to the Emergency Department and triage nurse.

## 2017-01-18 ENCOUNTER — Ambulatory Visit: Payer: Medicare Other | Admitting: Nutrition

## 2017-01-18 ENCOUNTER — Ambulatory Visit (HOSPITAL_BASED_OUTPATIENT_CLINIC_OR_DEPARTMENT_OTHER): Payer: Medicare Other

## 2017-01-18 VITALS — BP 122/74 | HR 91 | Temp 98.4°F | Resp 18

## 2017-01-18 DIAGNOSIS — C3492 Malignant neoplasm of unspecified part of left bronchus or lung: Secondary | ICD-10-CM | POA: Diagnosis not present

## 2017-01-18 DIAGNOSIS — Z5111 Encounter for antineoplastic chemotherapy: Secondary | ICD-10-CM

## 2017-01-18 DIAGNOSIS — C3490 Malignant neoplasm of unspecified part of unspecified bronchus or lung: Secondary | ICD-10-CM

## 2017-01-18 MED ORDER — SODIUM CHLORIDE 0.9 % IV SOLN
Freq: Once | INTRAVENOUS | Status: AC
  Administered 2017-01-18: 09:00:00 via INTRAVENOUS

## 2017-01-18 MED ORDER — DEXAMETHASONE SODIUM PHOSPHATE 10 MG/ML IJ SOLN: 10.0000 mg | Freq: Once | INTRAMUSCULAR | Status: DC

## 2017-01-18 MED ORDER — PEGFILGRASTIM 6 MG/0.6ML ~~LOC~~ PSKT
6.0000 mg | PREFILLED_SYRINGE | Freq: Once | SUBCUTANEOUS | Status: AC
  Administered 2017-01-18: 6 mg via SUBCUTANEOUS
  Filled 2017-01-18: qty 0.6

## 2017-01-18 MED ORDER — SODIUM CHLORIDE 0.9 % IV SOLN
10.0000 mg | Freq: Once | INTRAVENOUS | Status: AC
  Administered 2017-01-18: 10 mg via INTRAVENOUS
  Filled 2017-01-18: qty 1

## 2017-01-18 MED ORDER — SODIUM CHLORIDE 0.9 % IV SOLN
85.0000 mg/m2 | Freq: Once | INTRAVENOUS | Status: AC
  Administered 2017-01-18: 180 mg via INTRAVENOUS
  Filled 2017-01-18: qty 9

## 2017-01-18 NOTE — Progress Notes (Signed)
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 Julieanne Manson.  Past medical history includes UTI and hypokalemia  Medications include potassium chloride.  Labs include albumin 3.2 and potassium 3.9.  Height: 5 feet 5 inches. Weight: 216.1 pounds on July 23. Usual body weight: 257 pounds July 2018 BMI: 35.96.  Patient reports she does not like food at her nursing home and therefore, she is not eating She denies nutrition impact symptoms. States she does not like oral nutrition supplements.  Nutrition diagnosis: Severe malnutrition in the context of acute illness secondary to 16% weight loss and less than 50% energy intake for greater than 5 days.  Intervention: Educated patient to discuss food preferences with the staff at the nursing home. Encouraged patient to ask for snacks between meals. Patient refuses oral nutrition supplements. Educated patient on the importance of weight maintenance during chemotherapy. Teach back method used.  Monitoring, evaluation, goals: Patient will tolerate adequate calories and protein to minimize further weight loss.   Next visit: To be scheduled as needed.  **Disclaimer: This note was dictated with voice recognition software. Similar sounding words can inadvertently be transcribed and this note may contain transcription errors which may not have been corrected upon publication of note.**

## 2017-01-18 NOTE — Patient Instructions (Signed)
[facility] Discharge Instructions for Patients Receiving Chemotherapy  Today you received the following chemotherapy agents Etoposide (VP 16) To help prevent nausea and vomiting after your treatment, we encourage you to take your nausea medication as prescribed.   If you develop nausea and vomiting that is not controlled by your nausea medication, call the clinic.   BELOW ARE SYMPTOMS THAT SHOULD BE REPORTED IMMEDIATELY:  *FEVER GREATER THAN 100.5 F  *CHILLS WITH OR WITHOUT FEVER  NAUSEA AND VOMITING THAT IS NOT CONTROLLED WITH YOUR NAUSEA MEDICATION  *UNUSUAL SHORTNESS OF BREATH  *UNUSUAL BRUISING OR BLEEDING  TENDERNESS IN MOUTH AND THROAT WITH OR WITHOUT PRESENCE OF ULCERS  *URINARY PROBLEMS  *BOWEL PROBLEMS  UNUSUAL RASH Items with * indicate a potential emergency and should be followed up as soon as possible.  Feel free to call the clinic you have any questions or concerns. The clinic phone number is [phone].  Please show the [facility] at check-in to the Emergency Department and triage nurse.

## 2017-01-27 ENCOUNTER — Other Ambulatory Visit (HOSPITAL_BASED_OUTPATIENT_CLINIC_OR_DEPARTMENT_OTHER): Payer: Medicare Other

## 2017-01-27 ENCOUNTER — Telehealth: Payer: Self-pay | Admitting: *Deleted

## 2017-01-27 DIAGNOSIS — C3492 Malignant neoplasm of unspecified part of left bronchus or lung: Secondary | ICD-10-CM | POA: Diagnosis present

## 2017-01-27 DIAGNOSIS — C3490 Malignant neoplasm of unspecified part of unspecified bronchus or lung: Secondary | ICD-10-CM

## 2017-01-27 LAB — CBC WITH DIFFERENTIAL/PLATELET
BASO%: 7.6 % — AB (ref 0.0–2.0)
Basophils Absolute: 0.2 10*3/uL — ABNORMAL HIGH (ref 0.0–0.1)
EOS%: 0.7 % (ref 0.0–7.0)
Eosinophils Absolute: 0 10*3/uL (ref 0.0–0.5)
HCT: 31.4 % — ABNORMAL LOW (ref 34.8–46.6)
HGB: 9.9 g/dL — ABNORMAL LOW (ref 11.6–15.9)
LYMPH%: 50.3 % — AB (ref 14.0–49.7)
MCH: 25.5 pg (ref 25.1–34.0)
MCHC: 31.6 g/dL (ref 31.5–36.0)
MCV: 80.7 fL (ref 79.5–101.0)
MONO#: 0.2 10*3/uL (ref 0.1–0.9)
MONO%: 7.8 % (ref 0.0–14.0)
NEUT#: 0.9 10*3/uL — ABNORMAL LOW (ref 1.5–6.5)
NEUT%: 33.6 % — AB (ref 38.4–76.8)
Platelets: 64 10*3/uL — ABNORMAL LOW (ref 145–400)
RBC: 3.89 10*6/uL (ref 3.70–5.45)
RDW: 15.5 % — ABNORMAL HIGH (ref 11.2–14.5)
WBC: 2.7 10*3/uL — ABNORMAL LOW (ref 3.9–10.3)
lymph#: 1.4 10*3/uL (ref 0.9–3.3)
nRBC: 0 % (ref 0–0)

## 2017-01-27 LAB — TECHNOLOGIST REVIEW

## 2017-01-27 NOTE — Telephone Encounter (Signed)
-----   Message from Ladell Pier, MD sent at 01/27/2017  4:02 PM EDT ----- Please call patient, call for fever or bleeding, f/u as scheduled

## 2017-01-27 NOTE — Telephone Encounter (Signed)
Request to managed care to complete prior auth so CT may be scheduled.

## 2017-01-27 NOTE — Telephone Encounter (Signed)
[facility], spoke with pt's nurse, Bethena Roys. Informed nurse of labs, call office for bleeding, fever or other signs of infection. Bethena Roys voiced understanding. Confirmed next appt. Copy of CBC faxed to [employer] at [phone].

## 2017-01-31 NOTE — Telephone Encounter (Signed)
Called central scheduling to contact nursing home with CT appt.

## 2017-02-07 ENCOUNTER — Ambulatory Visit: Payer: Medicare Other | Admitting: Oncology

## 2017-02-07 ENCOUNTER — Ambulatory Visit (HOSPITAL_COMMUNITY): Payer: Medicare Other

## 2017-02-08 ENCOUNTER — Telehealth: Payer: Self-pay | Admitting: Emergency Medicine

## 2017-02-08 NOTE — Telephone Encounter (Signed)
This nurse tried to reach the patient by calling all numbers listed. Also called the [facility] where the patient was listed to be staying at, rehab states that pt was d/c home and is no longer in there care. This nurse was unable to reach the patient and her emergency contact. No numbers had voicemail's active.

## 2017-02-25 DEATH — deceased

## 2019-05-13 IMAGING — CT CT ANGIO CHEST
2 of 9 series · 17 of 46 positions shown · IV contrast (OMNI)
Comparison: Radiograph 12/10/2016

CLINICAL DATA: Follow-up abnormal chest x-ray

EXAM:
CT ANGIOGRAPHY CHEST WITH CONTRAST
TECHNIQUE: Multidetector CT imaging of the chest was performed using the
standard protocol during bolus administration of intravenous
contrast. Multiplanar CT image reconstructions and MIPs were
obtained to evaluate the vascular anatomy.
CONTRAST:  80 mL Isovue 370 intravenous

[Series 7: thins · axial · 0.69mm/px · z∈[+1061,+1355]mm · 14 of 324 slices shown]
[im 15/324  lung]
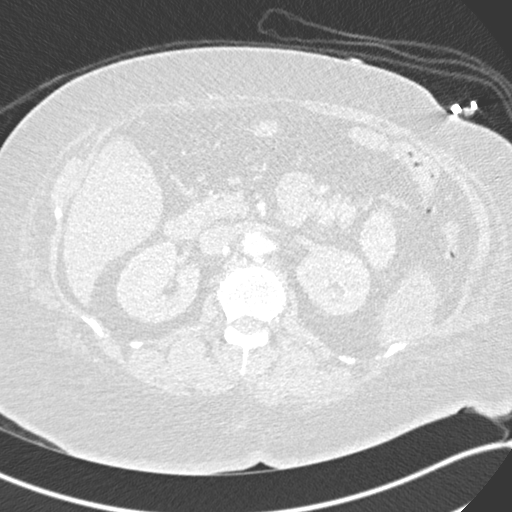
[im 45/324  soft-tissue]
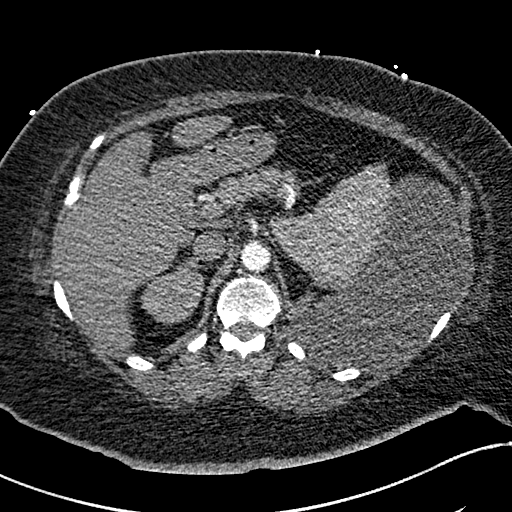
[im 59/324  lung]
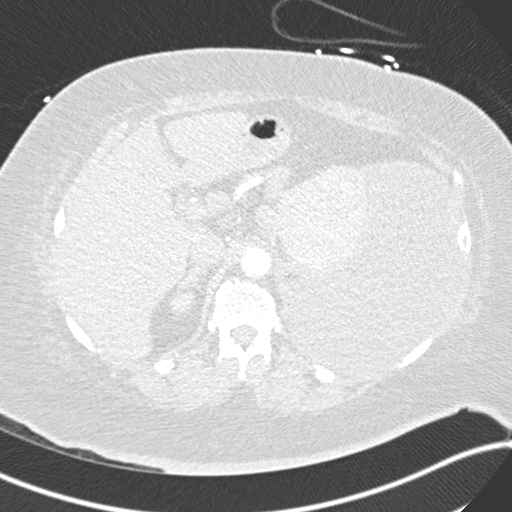
[im 89/324  soft-tissue]
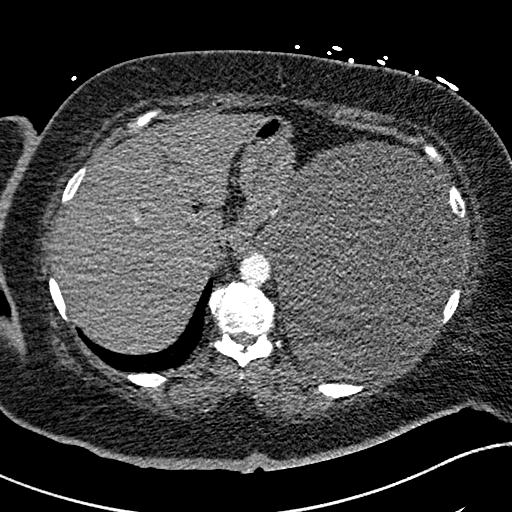
[im 103/324  lung]
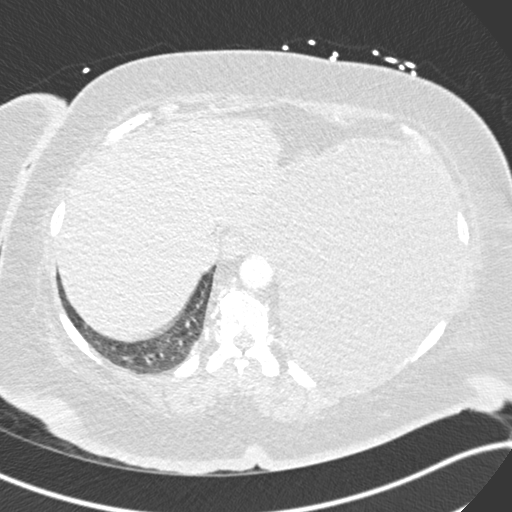
[im 133/324  soft-tissue]
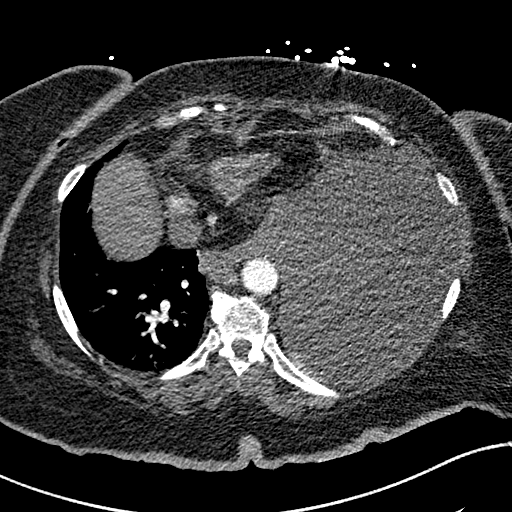
[im 147/324  lung]
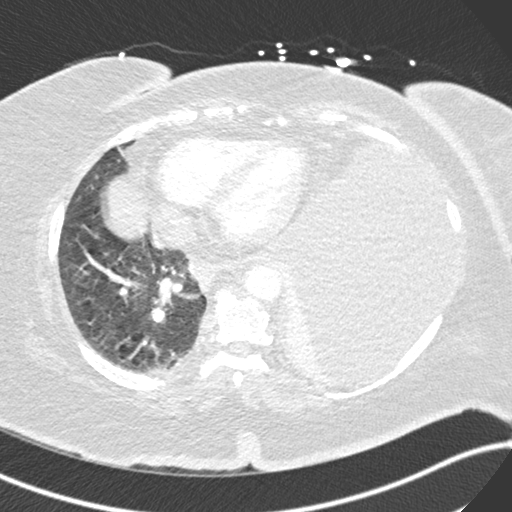
[im 177/324  soft-tissue]
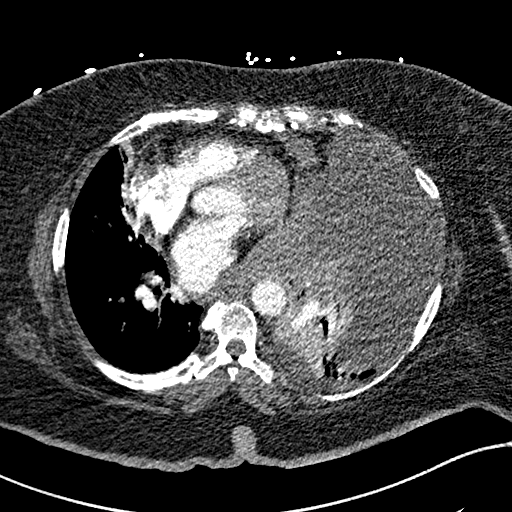
[im 191/324  lung]
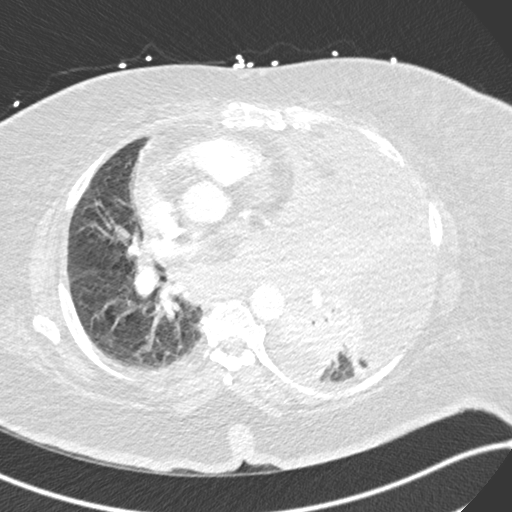
[im 221/324  soft-tissue]
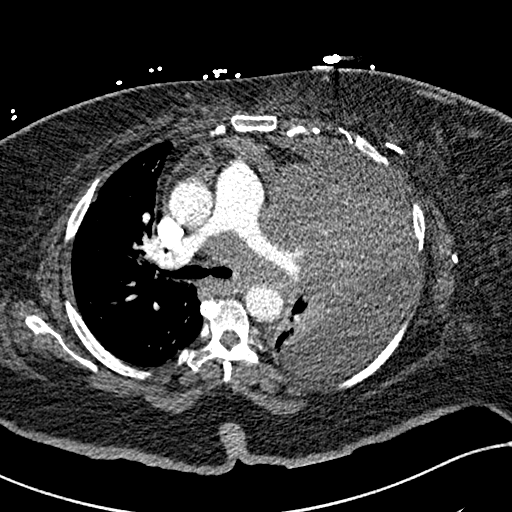
[im 235/324  lung]
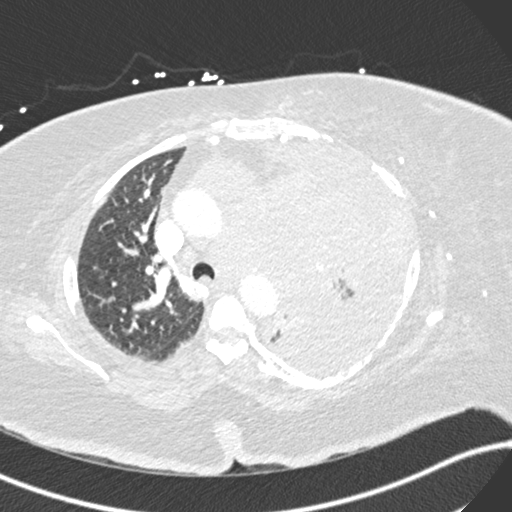
[im 265/324  soft-tissue]
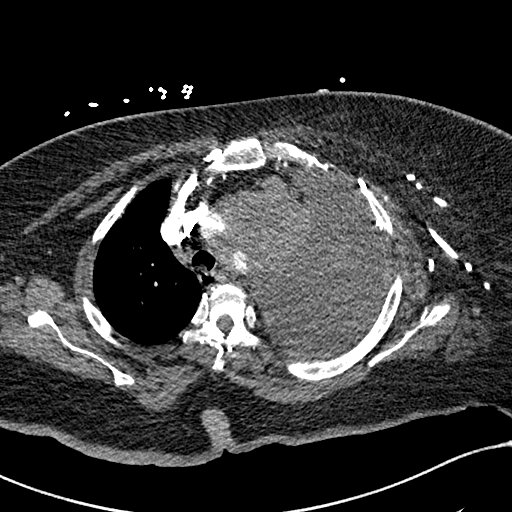
[im 279/324  lung]
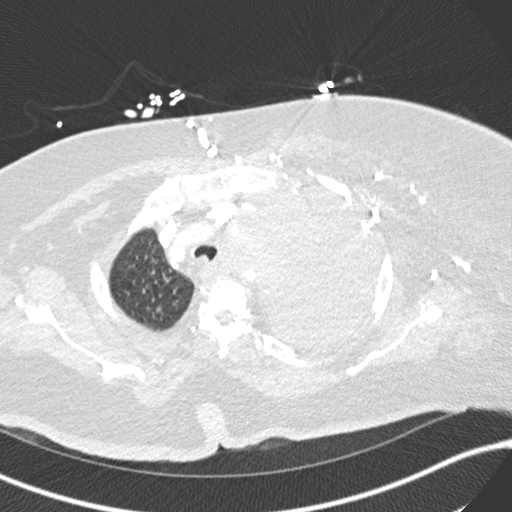
[im 309/324  soft-tissue]
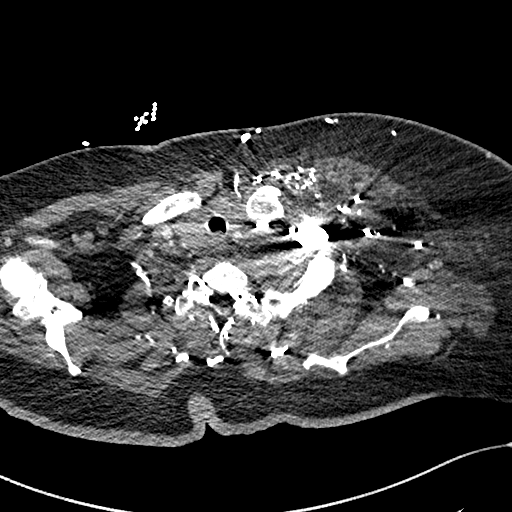

[Series 9: coronal mpr · coronal · 0.63mm/px · 3 of 140 slices shown]
[im 35/140  soft-tissue]
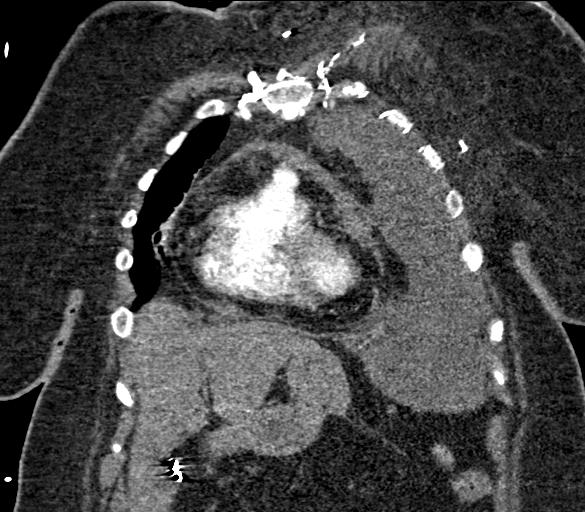
[im 70/140  soft-tissue]
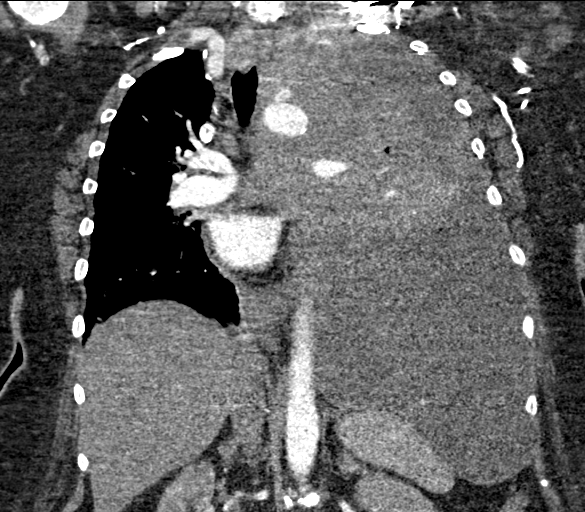
[im 105/140  soft-tissue]
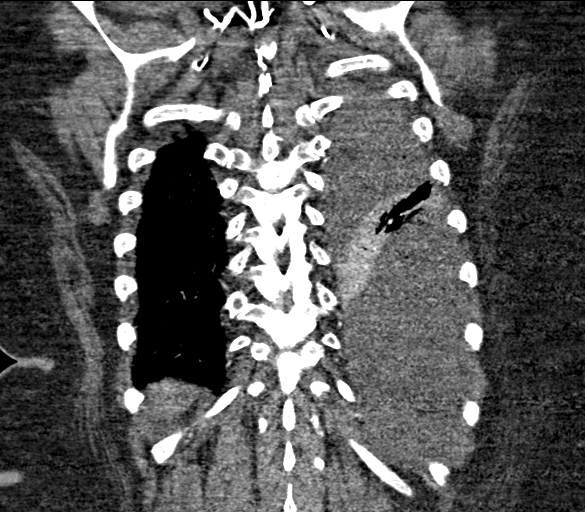

[17 of 46 positions shown; findings below may reference images not displayed]

FINDINGS: Cardiovascular: Excessive respiratory motion artifact limits the
examination. No central embolus is visualized. Tiny hypodensities
within sub segmental right lower lobe branch vessels could be due to
artifact although tiny emboli are not excluded. Non aneurysmal
aorta. Atherosclerosis. No dissection is seen.

There is narrowing of the left pulmonary artery and multiple left
upper lobe branch vessels due to a large mass in the lung. There is
tumor in case min and narrowing of the left common carotid and
subclavian vessels. Central venous obstruction by tumor with
multiple chest wall and paraspinal collateral vessels filling the
SVC and brachiocephalic vessels.

Mediastinum/Nodes: Midline trachea. Right lobe of thyroid within
normal limits. Poorly defined left lobe of thyroid. Esophagus
unremarkable. Infiltrative soft tissue mass extends into the
superior mediastinum/ thoracic inlet and involves the left hilar
structures. Tumor or matted adenopathy within the precarinal space.
Soft tissue mass in the subcarinal region measuring at least 2.9 cm.
Slight narrowed appearance of the left bronchus.

Lungs/Pleura: Large left pleural effusion with mild shift to the
right. There is atelectasis in the left lower lobe. Ill-defined soft
tissue mass in the left upper lobe, precise measurements difficult
due to the presence of adjacent pleural effusion and poorly defined
appearance of the mass. Mass measures at least 8.6 cm transverse by
9.1 cm AP by 12.8 cm craniocaudad.

Upper Abdomen: Surgical clips in the gallbladder fossa. No acute
abnormality is seen.

Musculoskeletal: No acute osseous abnormality.

Review of the MIP images confirms the above findings.
IMPRESSION: 1. Excessive respiratory motion artifact limits the study. No
central embolus is visualized. Under filling of subsegmental right
lower lobe pulmonary artery branch vessels could be secondary to
artifact although small emboli are difficult to exclude.
2. Large left pleural effusion. Large left upper lobe infiltrative
lung mass extending into the left apex. There is mediastinal and
hilar invasion by the mass. Vascular encasement of the left
pulmonary artery and upper lobe arterial branches by tumor. Tumor
encasement of the great vessels of the aortic arch, primarily the
left common carotid and subclavian vessels. Venous obstruction in
the upper mediastinum by the tumor with resultant extensive
collateral vessels in the chest wall and paraspinal region.
3. Soft tissue density within the left hilus and subcarinal region
could relate to additional tumor or matted adenopathy.

Aortic Atherosclerosis ([id number].J).
